# Patient Record
Sex: Female | Born: 1935 | Race: White | Hispanic: No | Smoking: Former smoker
Health system: Southern US, Community
[De-identification: ages and names within clinical notes are randomized; demographics above are authoritative.]

## PROBLEM LIST (undated history)

## (undated) DIAGNOSIS — D649 Anemia, unspecified: Secondary | ICD-10-CM

## (undated) DIAGNOSIS — N289 Disorder of kidney and ureter, unspecified: Secondary | ICD-10-CM

## (undated) DIAGNOSIS — G309 Alzheimer's disease, unspecified: Secondary | ICD-10-CM

## (undated) DIAGNOSIS — F0280 Dementia in other diseases classified elsewhere without behavioral disturbance: Secondary | ICD-10-CM

## (undated) DIAGNOSIS — I10 Essential (primary) hypertension: Secondary | ICD-10-CM

## (undated) HISTORY — PX: OVARIAN CYST REMOVAL: SHX89

## (undated) HISTORY — PX: CHOLECYSTECTOMY: SHX55

## (undated) HISTORY — PX: ABDOMINAL HYSTERECTOMY: SHX81

## (undated) HISTORY — PX: APPENDECTOMY: SHX54

---

## 1997-08-23 HISTORY — PX: OTHER SURGICAL HISTORY: SHX169

## 1998-01-07 ENCOUNTER — Ambulatory Visit (HOSPITAL_COMMUNITY): Admission: RE | Admit: 1998-01-07 | Discharge: 1998-01-07 | Payer: Self-pay | Admitting: Ophthalmology

## 1998-03-17 ENCOUNTER — Ambulatory Visit (HOSPITAL_COMMUNITY): Admission: RE | Admit: 1998-03-17 | Discharge: 1998-03-17 | Payer: Self-pay | Admitting: Ophthalmology

## 1998-05-05 ENCOUNTER — Ambulatory Visit (HOSPITAL_COMMUNITY): Admission: RE | Admit: 1998-05-05 | Discharge: 1998-05-05 | Payer: Self-pay | Admitting: Specialist

## 1998-06-25 ENCOUNTER — Ambulatory Visit (HOSPITAL_COMMUNITY): Admission: RE | Admit: 1998-06-25 | Discharge: 1998-06-25 | Payer: Self-pay | Admitting: Specialist

## 1998-08-17 ENCOUNTER — Emergency Department (HOSPITAL_COMMUNITY): Admission: EM | Admit: 1998-08-17 | Discharge: 1998-08-17 | Payer: Self-pay | Admitting: Emergency Medicine

## 2000-05-13 ENCOUNTER — Encounter: Payer: Self-pay | Admitting: Internal Medicine

## 2000-05-13 ENCOUNTER — Encounter: Admission: RE | Admit: 2000-05-13 | Discharge: 2000-05-13 | Payer: Self-pay | Admitting: Internal Medicine

## 2000-05-20 ENCOUNTER — Inpatient Hospital Stay (HOSPITAL_COMMUNITY): Admission: AD | Admit: 2000-05-20 | Discharge: 2000-06-06 | Payer: Self-pay | Admitting: Internal Medicine

## 2000-05-20 ENCOUNTER — Encounter (INDEPENDENT_AMBULATORY_CARE_PROVIDER_SITE_OTHER): Payer: Self-pay | Admitting: *Deleted

## 2000-05-21 ENCOUNTER — Encounter: Payer: Self-pay | Admitting: Internal Medicine

## 2000-05-23 ENCOUNTER — Encounter: Payer: Self-pay | Admitting: Internal Medicine

## 2000-05-25 ENCOUNTER — Encounter: Payer: Self-pay | Admitting: General Surgery

## 2000-05-25 ENCOUNTER — Encounter: Payer: Self-pay | Admitting: Internal Medicine

## 2000-05-26 ENCOUNTER — Encounter: Payer: Self-pay | Admitting: Internal Medicine

## 2000-06-06 ENCOUNTER — Inpatient Hospital Stay: Admission: RE | Admit: 2000-06-06 | Discharge: 2000-06-14 | Payer: Self-pay | Admitting: Internal Medicine

## 2000-09-27 ENCOUNTER — Encounter (INDEPENDENT_AMBULATORY_CARE_PROVIDER_SITE_OTHER): Payer: Self-pay | Admitting: Specialist

## 2000-09-27 ENCOUNTER — Ambulatory Visit (HOSPITAL_BASED_OUTPATIENT_CLINIC_OR_DEPARTMENT_OTHER): Admission: RE | Admit: 2000-09-27 | Discharge: 2000-09-27 | Payer: Self-pay | Admitting: General Surgery

## 2000-12-06 ENCOUNTER — Encounter: Payer: Self-pay | Admitting: Internal Medicine

## 2000-12-06 ENCOUNTER — Ambulatory Visit (HOSPITAL_COMMUNITY): Admission: RE | Admit: 2000-12-06 | Discharge: 2000-12-06 | Payer: Self-pay | Admitting: *Deleted

## 2000-12-06 ENCOUNTER — Encounter (INDEPENDENT_AMBULATORY_CARE_PROVIDER_SITE_OTHER): Payer: Self-pay | Admitting: Specialist

## 2007-03-12 ENCOUNTER — Encounter: Admission: RE | Admit: 2007-03-12 | Discharge: 2007-03-12 | Payer: Self-pay | Admitting: Internal Medicine

## 2007-03-12 ENCOUNTER — Encounter: Payer: Self-pay | Admitting: Internal Medicine

## 2007-03-15 ENCOUNTER — Inpatient Hospital Stay (HOSPITAL_COMMUNITY): Admission: EM | Admit: 2007-03-15 | Discharge: 2007-03-28 | Payer: Self-pay | Admitting: Emergency Medicine

## 2007-03-21 ENCOUNTER — Encounter (INDEPENDENT_AMBULATORY_CARE_PROVIDER_SITE_OTHER): Payer: Self-pay | Admitting: Internal Medicine

## 2007-03-21 ENCOUNTER — Ambulatory Visit: Payer: Self-pay | Admitting: Vascular Surgery

## 2007-03-23 ENCOUNTER — Encounter: Payer: Self-pay | Admitting: Internal Medicine

## 2007-03-27 ENCOUNTER — Ambulatory Visit: Payer: Self-pay | Admitting: Physical Medicine & Rehabilitation

## 2007-04-14 ENCOUNTER — Emergency Department (HOSPITAL_COMMUNITY): Admission: EM | Admit: 2007-04-14 | Discharge: 2007-04-14 | Payer: Self-pay | Admitting: Emergency Medicine

## 2007-08-24 LAB — CONVERTED CEMR LAB: Pap Smear: NEGATIVE

## 2009-02-26 IMAGING — PT NM PET TUM IMG SKULL BASE T - THIGH
6 series · 25 of 25 positions shown · non-contrast
Comparison: CT dated 03/15/2007

CLINICAL DATA: Evaluate pulmonary nodule

FDG PET-CT TUMOR IMAGING (SKULL BASE TO THIGHS):
Fasting Blood Glucose:  95
TECHNIQUE: 16.3 mCi F-18 FDG was injected intravenously via the left
antecubital fossa .  Full-ring PET imaging was performed from the skull base
through the mid-thighs 113 minutes after injection.  CT data was obtained and
used for attenuation correction and anatomic localization only.  (This was not
acquired as a diagnostic CT examination.)

[Series 1: pet ac · axial · 3.3mm · 4.69mm/px · z∈[-751,-25]mm · 5 of 223 slices shown]
[im 1/223]
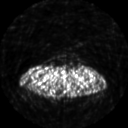
[im 56/223]
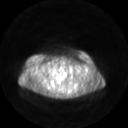
[im 112/223]
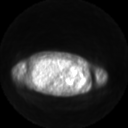
[im 167/223]
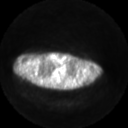
[im 223/223]
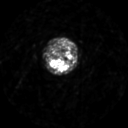

[Series 2: pet nac · axial · 3.3mm · 4.69mm/px · z∈[-751,-25]mm · 6 of 223 slices shown]
[im 1/223]
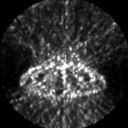
[im 45/223]
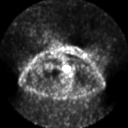
[im 89/223]
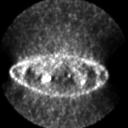
[im 134/223]
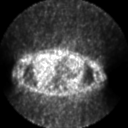
[im 178/223]
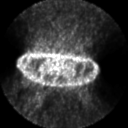
[im 223/223]
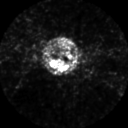

[Series 2: ct images · axial · 3.8mm · 0.98mm/px · z∈[-751,-26]mm · 5 of 223 slices shown]
[im 1/223]
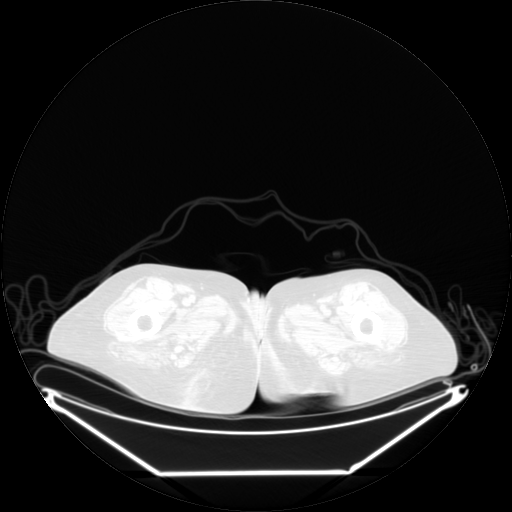
[im 56/223]
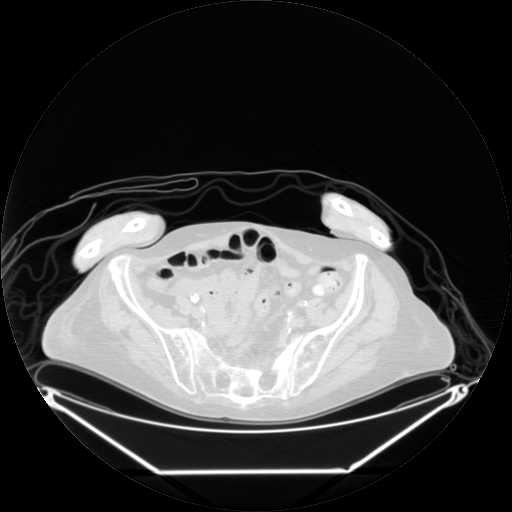
[im 112/223]
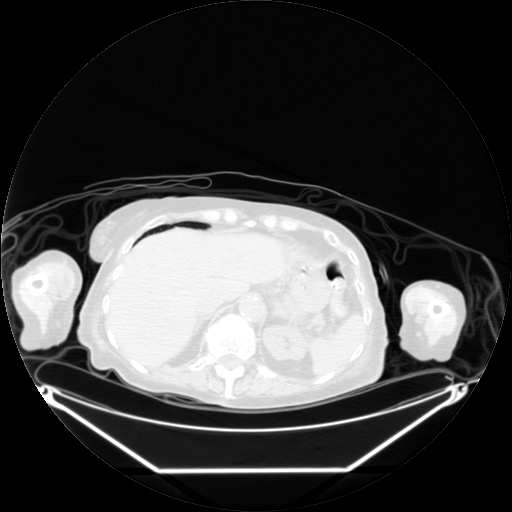
[im 167/223]
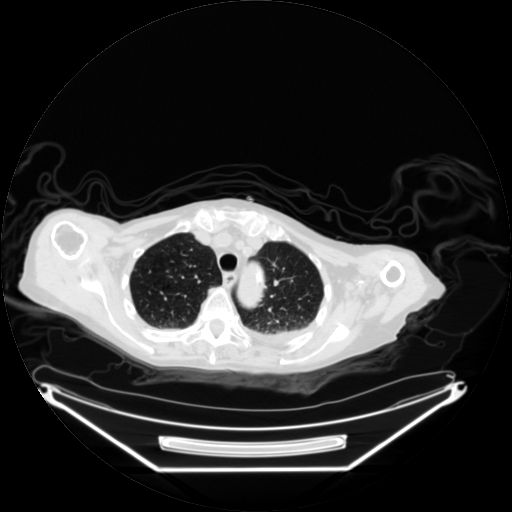
[im 223/223]
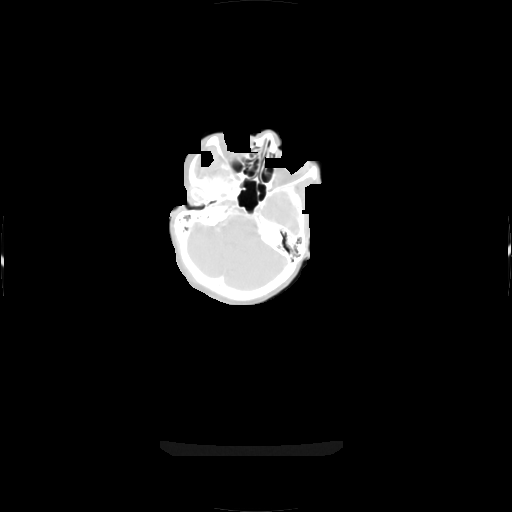

[Series 123: mip · coronal · 3.3mm · 4.69mm/px · 1 of 30 slices shown]
[im 1/30]
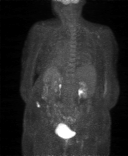

[Series 151: reformatted · axial · 3.3mm · 3.91mm/px · z∈[-751,-25]mm · 6 of 221 slices shown (1 of 2)]
[im 1/221]
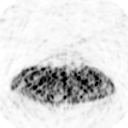
[im 45/221]
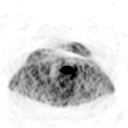
[im 89/221]
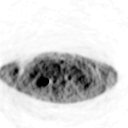
[im 133/221]
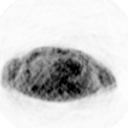
[im 177/221]
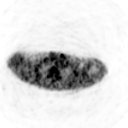
[im 221/221]
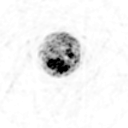

[Series 153: reformatted · coronal · 4.7mm · 5.83mm/px · 2 of 66 slices shown (2 of 2)]
[im 1/66]
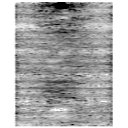
[im 66/66]
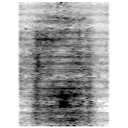

[25 of 25 positions shown; findings below may reference images not displayed]

FINDINGS: There is no hypermetabolic lymph nodes within the soft tissues of the
neck.

Negative for hypermetabolic activity within the axillary or supraclavicular
regions.

Negative for hypermetabolic mediastinal, or hilar lymph nodes.

There is bilateral pleural effusions, left greater than right with overlying
areas of atelectasis.

Pulmonary nodule within the right upper lobe measures 12.1 mm. There is no
abnormal uptake within this nodule. 

No additional pulmonary nodules or masses are identified.

There is atelectasis overlying both effusions.

Calcified gallstones. There is mild gallbladder wall thickening and a small
amount of pericholecystic fluid. 

The pancreas is unremarkable.

There is no hypermetabolic retroperitoneal OR small bowel mesentery lymph nodes.

No free fluid is identified.

There is no hypermetabolic pelvic, or inguinal lymph nodes.

Review of the visualized osseous structures shows no abnormal focus of increased
radiotracer uptake to suggest hypermetabolic bone metastasis.
IMPRESSION: 1. The pulmonary nodule described on prior chest CT does not exhibit any
abnormal FDG uptake likely representing a benign lesion such as a hamartoma or
noncalcified granuloma. As certain lung cancers may have a low metabolic state
and can present as a false negative on PET/CT I would recommend that a followup
chest CT be performed in 3 months. If at this time it this nodule remains stable
then a followup exam in 12 at should be obtained.

2. Mild gallbladder wall thickening and a small amount of pericholecystic fluid.
This may be a nonspecific finding in a patient with heart failure. If there is a
concern for acute cholecystitis a right upper quadrant sonogram would be
recommended .

## 2009-03-10 ENCOUNTER — Encounter: Payer: Self-pay | Admitting: Internal Medicine

## 2009-05-04 ENCOUNTER — Observation Stay (HOSPITAL_COMMUNITY): Admission: EM | Admit: 2009-05-04 | Discharge: 2009-05-05 | Payer: Self-pay | Admitting: Emergency Medicine

## 2009-05-04 ENCOUNTER — Ambulatory Visit: Payer: Self-pay | Admitting: Gastroenterology

## 2009-05-05 ENCOUNTER — Encounter: Payer: Self-pay | Admitting: Internal Medicine

## 2009-05-05 LAB — CONVERTED CEMR LAB
Hgb A1c MFr Bld: 6.3 %
LDL Cholesterol: 30 mg/dL
TSH: 0.912 microintl units/mL

## 2009-05-12 ENCOUNTER — Ambulatory Visit: Payer: Self-pay | Admitting: Gastroenterology

## 2009-05-26 ENCOUNTER — Encounter: Payer: Self-pay | Admitting: Gastroenterology

## 2009-05-26 ENCOUNTER — Ambulatory Visit: Payer: Self-pay | Admitting: Gastroenterology

## 2009-05-29 ENCOUNTER — Encounter: Payer: Self-pay | Admitting: Gastroenterology

## 2009-09-22 ENCOUNTER — Encounter: Payer: Self-pay | Admitting: Internal Medicine

## 2009-11-03 ENCOUNTER — Inpatient Hospital Stay (HOSPITAL_COMMUNITY): Admission: EM | Admit: 2009-11-03 | Discharge: 2009-11-18 | Payer: Self-pay | Admitting: Emergency Medicine

## 2009-11-06 ENCOUNTER — Ambulatory Visit: Payer: Self-pay | Admitting: Internal Medicine

## 2009-11-06 ENCOUNTER — Encounter: Payer: Self-pay | Admitting: Internal Medicine

## 2009-11-06 LAB — CONVERTED CEMR LAB
Cholesterol: 98 mg/dL
HDL: 34 mg/dL
LDL Cholesterol: 41 mg/dL

## 2009-11-11 ENCOUNTER — Encounter: Payer: Self-pay | Admitting: Internal Medicine

## 2009-11-11 LAB — CONVERTED CEMR LAB
ALT: 36 U/L
AST: 15 U/L
Albumin: 2.2 g/dL
Alkaline Phosphatase: 114 U/L
BUN: 4 mg/dL
CO2: 20 meq/L
Calcium: 8.7 mg/dL
Chloride: 112 meq/L
Creatinine, Ser: 1.33 mg/dL
Glucose, Bld: 98 mg/dL
HCT: 30 %
Platelets: 269 K/uL
Potassium: 3.3 meq/L
RBC: 3.05 M/uL
Sodium: 138 meq/L
Total Bilirubin: 0.6 mg/dL
Total Protein: 5.6 g/dL
WBC: 5.5 10*3/microliter

## 2009-11-14 ENCOUNTER — Encounter (INDEPENDENT_AMBULATORY_CARE_PROVIDER_SITE_OTHER): Payer: Self-pay | Admitting: Internal Medicine

## 2009-11-14 ENCOUNTER — Encounter (INDEPENDENT_AMBULATORY_CARE_PROVIDER_SITE_OTHER): Payer: Self-pay | Admitting: *Deleted

## 2009-11-16 ENCOUNTER — Encounter (INDEPENDENT_AMBULATORY_CARE_PROVIDER_SITE_OTHER): Payer: Self-pay | Admitting: *Deleted

## 2009-11-16 ENCOUNTER — Encounter: Payer: Self-pay | Admitting: Internal Medicine

## 2009-11-16 LAB — CONVERTED CEMR LAB
AST: 17 units/L
Albumin: 2.4 g/dL
Alkaline Phosphatase: 99 units/L
CO2: 24 meq/L
Chloride: 106 meq/L
Creatinine, Ser: 1.19 mg/dL
HCT: 32.4 %
Hemoglobin: 11.2 g/dL
Platelets: 359 10*3/uL
RBC: 3.38 M/uL
Sodium: 135 meq/L
WBC: 8.8 10*3/uL

## 2009-11-17 ENCOUNTER — Ambulatory Visit: Payer: Self-pay | Admitting: Vascular Surgery

## 2009-11-17 ENCOUNTER — Encounter: Payer: Self-pay | Admitting: Internal Medicine

## 2009-11-17 ENCOUNTER — Encounter (INDEPENDENT_AMBULATORY_CARE_PROVIDER_SITE_OTHER): Payer: Self-pay | Admitting: Internal Medicine

## 2009-11-17 ENCOUNTER — Encounter (INDEPENDENT_AMBULATORY_CARE_PROVIDER_SITE_OTHER): Payer: Self-pay | Admitting: Family Medicine

## 2009-11-17 LAB — CONVERTED CEMR LAB
ALT: 15 units/L
Albumin: 2.3 g/dL
Alkaline Phosphatase: 92 units/L
BUN: 13 mg/dL
CO2: 25 meq/L
Creatinine, Ser: 1.23 mg/dL
HCT: 30.2 %
Platelets: 347 10*3/uL
RBC: 3.15 M/uL
Total Bilirubin: 0.9 mg/dL

## 2009-11-18 ENCOUNTER — Encounter (INDEPENDENT_AMBULATORY_CARE_PROVIDER_SITE_OTHER): Payer: Self-pay | Admitting: *Deleted

## 2009-11-21 ENCOUNTER — Telehealth: Payer: Self-pay | Admitting: Internal Medicine

## 2010-01-21 ENCOUNTER — Encounter: Payer: Self-pay | Admitting: Internal Medicine

## 2010-01-21 DIAGNOSIS — I10 Essential (primary) hypertension: Secondary | ICD-10-CM

## 2010-02-02 ENCOUNTER — Ambulatory Visit: Payer: Self-pay | Admitting: Internal Medicine

## 2010-02-10 ENCOUNTER — Encounter: Payer: Self-pay | Admitting: Internal Medicine

## 2010-03-05 ENCOUNTER — Ambulatory Visit: Payer: Self-pay | Admitting: Internal Medicine

## 2010-05-01 ENCOUNTER — Ambulatory Visit: Payer: Self-pay | Admitting: Internal Medicine

## 2010-05-02 LAB — CONVERTED CEMR LAB
ALT: 14 units/L (ref 0–35)
BUN: 23 mg/dL (ref 6–23)
Basophils Absolute: 0.1 10*3/uL (ref 0.0–0.1)
CO2: 25 meq/L (ref 19–32)
Chloride: 103 meq/L (ref 96–112)
Creatinine, Ser: 1.3 mg/dL — ABNORMAL HIGH (ref 0.4–1.2)
Eosinophils Absolute: 0.1 10*3/uL (ref 0.0–0.7)
Eosinophils Relative: 1.9 % (ref 0.0–5.0)
Glucose, Bld: 98 mg/dL (ref 70–99)
HCT: 37.1 % (ref 36.0–46.0)
Lipase: 29 units/L (ref 11.0–59.0)
Lymphs Abs: 2 10*3/uL (ref 0.7–4.0)
MCHC: 34.5 g/dL (ref 30.0–36.0)
MCV: 96.1 fL (ref 78.0–100.0)
Monocytes Absolute: 0.6 10*3/uL (ref 0.1–1.0)
Platelets: 320 10*3/uL (ref 150.0–400.0)
Potassium: 3.7 meq/L (ref 3.5–5.1)
RDW: 13.5 % (ref 11.5–14.6)
TSH: 0.59 microintl units/mL (ref 0.35–5.50)
Total Bilirubin: 0.6 mg/dL (ref 0.3–1.2)

## 2010-05-04 ENCOUNTER — Telehealth: Payer: Self-pay | Admitting: Gastroenterology

## 2010-05-06 ENCOUNTER — Ambulatory Visit: Payer: Self-pay | Admitting: Gastroenterology

## 2010-05-11 ENCOUNTER — Ambulatory Visit: Payer: Self-pay | Admitting: Internal Medicine

## 2010-05-11 LAB — CONVERTED CEMR LAB
Cholesterol: 159 mg/dL (ref 0–200)
HDL: 49.1 mg/dL (ref >39.00)

## 2010-05-27 ENCOUNTER — Ambulatory Visit: Payer: Self-pay | Admitting: Gastroenterology

## 2010-09-20 LAB — CONVERTED CEMR LAB
ALT: 27 units/L
ALT: 28 units/L
AST: 22 units/L
Albumin: 4.2 g/dL
BUN: 20 mg/dL
Calcium: 10.3 mg/dL
Calcium: 10.5 mg/dL
Chloride: 102 meq/L
Creatinine, Ser: 1.3 mg/dL
Glucose, Bld: 100 mg/dL
Glucose, Bld: 92 mg/dL
HCT: 37.9 %
Hemoglobin: 13 g/dL
Potassium: 4 meq/L
Potassium: 4.3 meq/L
Sodium: 138 meq/L
Total Bilirubin: 0.5 mg/dL
Total Protein: 8.2 g/dL
Total Protein: 8.3 g/dL

## 2010-09-24 NOTE — Assessment & Plan Note (Signed)
Summary: new pt/medicare/bcbs/#/lb   Vital Signs:  Patient profile:   77 year old female Height:      64.5 inches (163.83 cm) Weight:      122.8 pounds (55.82 kg) BMI:     20.83 O2 Sat:      95 % on Room air Temp:     97.2 degrees F (36.22 degrees C) oral Pulse rate:   76 / minute BP sitting:   132 / 70  (left arm) Cuff size:   regular  Vitals Entered By: Orlan Leavens (February 02, 2010 3:10 PM)  O2 Flow:  Room air CC: New patient/ Req [facility] on [facility] Is Patient Diabetic? No Pain Assessment Patient in pain? no        Primary Care Provider:  Newt Lukes MD  CC:  New patient/ Req refills on [facility].  History of Present Illness: new pt to me and our practice, here to est care prev followed at [facility] medical but txfr care here  1) recent hosp 10/2009 for bilary pancreatitis - dc summary and labs reviewed -  still recovering from surg but feeling well - no abd pain or trouble with by mouth intake -  no diarrhea or n/v, no anorexia or weight loss - still holding hctz and statin from hosp for same  2) HTN - reports compliance with ongoing medical treatment and no changes in medication dose or frequency. denies adverse side effects related to current therapy. no CP, edema, HA or vision changes  3) dyslipidemia- reports compliance with ongoing medical treatment and no changes in medication dose or frequency. denies adverse side effects related to current therapy. as above, off statin tx last 6 weeks since hosp due to panc but tol low dose simva for years w/o problems  4) c/o anxiety - long hx same, symptoms present > 2 yrs - gradual worsening of symptoms since illness -  no panic attacks or insomnia, just "nervous all the time" never on meds for same and declines meds that might cause sedation or "addiction"  Preventive Screening-Counseling & Management  Alcohol-Tobacco     Alcohol drinks/day: 0  Alcohol Counseling: not indicated; patient does not drink     Smoking Status: quit     Tobacco Counseling: to remain off tobacco products  Caffeine-Diet-Exercise     Exercise Counseling: not indicated; exercise is adequate     Depression Counseling: not indicated; screening negative for depression  Safety-Violence-Falls     Seat Belt Counseling: not indicated; patient wears seat belts     Firearms in the Home: no firearms in the home     Smoke Detector Counseling: n/a     Violence Counseling: not indicated; no violence risk noted     Fall Risk Counseling: not indicated; no significant falls noted  Clinical Review Panels:  Prevention   Last Mammogram:  No specific mammographic evidence of malignancy.   (08/24/2007)   Last Pap Smear:  Interpretation/Result:Negative for intraepithelial Lesion or Malignancy.    (08/24/2007)   Last Colonoscopy:  Location:  [facility].  (05/26/2009)  Lipid Management   Cholesterol:  98 (11/06/2009)   LDL (bad choesterol):  41 (11/06/2009)   HDL (good cholesterol):  34 (11/06/2009)   Triglycerides:  113 (11/06/2009)  CBC   WBC:  7.6 (11/17/2009)   RBC:  3.15 (11/17/2009)   Hgb:  10.4 (11/17/2009)   Hct:  30.2 (11/17/2009)   Platelets:  347 (  11/17/2009)  Complete Metabolic Panel   Glucose:  106 (11/17/2009)   Sodium:  135 (11/17/2009)   Potassium:  4.0 (11/17/2009)   Chloride:  105 (11/17/2009)   CO2:  25 (11/17/2009)   BUN:  13 (11/17/2009)   Creatinine:  1.23 (11/17/2009)   Albumin:  2.3 (11/17/2009)   Total Protein:  6.0 (11/17/2009)   Calcium:  9.1 (11/17/2009)   Total Bili:  0.9 (11/17/2009)   Alk Phos:  92 (11/17/2009)   SGPT (ALT):  15 (11/17/2009)   SGOT (AST):  16 (11/17/2009)   Current Medications (verified): 1)  Triamterene-Hctz 37.5-25 Mg Tabs (Triamterene-Hctz) .... Take 1/2 By Mouth Once Daily 2)  Temazepam 7.5 Mg Caps (Temazepam) .... Take 1 By Mouth At Bedtime As Needed  Allergies (verified): 1)  !  Celexa 2)  ! Aspirin 3)  ! Monopril 4)  ! * Hyddromorphone  Past History:  Past Medical History: Hyperlipidemia Hypertension Asthma Colonic polyps, hx of  Past Surgical History: Cholecystectomy Intestinal surgery for chrons 1999  Family History: Family History Hypertension (other relative) Stroke (other relative) Family History Diabetes 1st degree relative (other relative)  Social History: Former Smoker, no alcohol widowed, lives alone in her own home - drives and indep ADLs supportive neice judy jones who assists pt as needed  Smoking Status:  quit  Review of Systems       see HPI above. I have reviewed all other systems and they were negative.   Physical Exam  General:  alert, well-developed, well-nourished, and cooperative to examination.   young appearing for stated age, neice judy at side Eyes:  vision grossly intact; pupils equal, round and reactive to light.  conjunctiva and lids normal.   wears glasses Ears:  normal pinnae bilaterally, without erythema, swelling, or tenderness to palpation. TMs clear, without effusion, or cerumen impaction. Hearing grossly normal bilaterally  Mouth:  teeth and gums in good repair; mucous membranes moist, without lesions or ulcers. oropharynx clear without exudate, no erythema.  Lungs:  normal respiratory effort, no intercostal retractions or use of accessory muscles; normal breath sounds bilaterally - no crackles and no wheezes.    Heart:  normal rate, regular rhythm, no murmur, and no rub. BLE without edema. normal DP pulses and normal cap refill in all 4 extremities    Abdomen:  soft, non-tender, normal bowel sounds, no distention; no masses and no appreciable hepatomegaly or splenomegaly.   Msk:  No deformity or scoliosis noted of thoracic or lumbar spine.   Neurologic:  alert & oriented X3 and cranial nerves II-XII symetrically intact.  strength normal in all extremities, sensation intact to light touch, and gait normal. speech  fluent without dysarthria or aphasia; follows commands with good comprehension.  Skin:  no rashes, vesicles, ulcers, or erythema. No nodules or irregularity to palpation.  Psych:  Oriented X3, memory intact for recent and remote, normally interactive, good eye contact, not anxious appearing, not depressed appearing, and not agitated.      Impression & Recommendations:  Problem # 1:  Hosp for GALLSTONE PANCREATITIS (ICD-577.9) hosp course,labs, procedures and dc summary reviewed - s/p chole - symptoms improved - still holding statin and hctz - likely same to resume each now but will do so next visit as starting other new meds for anxiety - see next  Problem # 2:  ANXIETY (ICD-300.00)  long hx same - will start SSRI - slow titiration of med for effective symptoms mgmt - new erx done f/u 4-6 weeks to review med  and symptoms - Time spent with patient/niece 45 minutes, more than 50% of this time was spent counseling patient on anxiety and recent hospitalization Her updated medication list for this problem includes:    Paroxetine Hcl 12.5 Mg Xr24h-tab (Paroxetine hcl) .Marland Kitchen... 1 by mouth once daily x 2 weesk, then increase to 2 by mouth once daily  (or as directed)  Orders: Prescription Created Electronically [phone])  Problem # 3:  HYPERLIPIDEMIA (ICD-272.4)  holding statin since [facility] hosp - cont same for now until tolerance of SSRI established so as not to confuse potential SE or adv rxn - also review prior labs form prior PCP - to send for records The following medications were removed from the medication list:    Simvastatin 20 Mg Tabs (Simvastatin) .Marland Kitchen... Take 1 by mouth at bedtime  Labs Reviewed: SGOT: 16 (11/17/2009)   SGPT: 15 (11/17/2009)   HDL:34 (11/06/2009), 49 (05/05/2009)  LDL:41 (11/06/2009), 30 (05/05/2009)  Chol:98 (11/06/2009), 95 (05/05/2009)  Trig:113 (11/06/2009), 80 (05/05/2009)  Problem # 4:  HYPERTENSION (ICD-401.9)  The following medications were removed from the  medication list:    Amlodipine Besylate 10 Mg Tabs (Amlodipine besylate) .Marland Kitchen... Take 1 by mouth once daily Her updated medication list for this problem includes:    Triamterene-hctz 37.5-25 Mg Tabs (Triamterene-hctz) .Marland Kitchen... Take 1/2 by mouth once daily  BP today: 132/70  Labs Reviewed: K+: 4.0 (11/17/2009) Creat: : 1.23 (11/17/2009)   Chol: 98 (11/06/2009)   HDL: 34 (11/06/2009)   LDL: 41 (11/06/2009)   TG: 113 (11/06/2009)  Complete Medication List: 1)  Triamterene-hctz 37.5-25 Mg Tabs (Triamterene-hctz) .... Take 1/2 by mouth once daily 2)  Temazepam 7.5 Mg Caps (Temazepam) .... Take 1 by mouth at bedtime as needed 3)  Paroxetine Hcl 12.5 Mg Xr24h-tab (Paroxetine hcl) .Marland Kitchen.. 1 by mouth once daily x 2 weesk, then increase to 2 by mouth once daily  (or as directed)  Patient Instructions: 1)  it was good to see you today. 2)  your hospitalization labs and medications have been reviewed today 3)  will send for release of records from [facility] as discussed to review and incorporate into our records here. 4)  start generic low dose paxil (paroxetine) for anxiety as discussed - this prescription has been electronically submitted to your local pharmacy. Please take as directed. Contact our office if you believe you're having problems with the medication(s).  5)  refill on other medications as requested - continue to hold hctz and simvastatin for now 6)  Please schedule a follow-up appointment in 4-6 weeks to review anxiety symptoms and medication effects, call sooner if problems.  Prescriptions: PAROXETINE HCL 12.5 MG XR24H-TAB (PAROXETINE HCL) 1 by mouth once daily x 2 weesk, then increase to 2 by mouth once daily  (or as directed)  #60 x 1   Entered and Authorized by:   Newt Lukes MD   Signed by:   Newt Lukes MD on 02/02/2010   Method used:   Electronically to        [facility] [address] (retail)       [address]       [place],  [place]  [address]       Ph: [phone] or [phone]       Fax: [phone]   RxID:   [id number] TRIAMTERENE-HCTZ 37.5-25 MG TABS (TRIAMTERENE-HCTZ) take 1/2 by mouth once daily  #45 x 2   Entered by:  Orlan Leavens   Authorized by:   Newt Lukes MD   Signed by:   Orlan Leavens on 02/02/2010   Method used:   Faxed to ...       [facility])             , [place]         Ph: [phone]       Fax: [phone]   RxID:   [id number]    Immunization History:  Pneumovax Immunization History:    Pneumovax:  historical (08/23/2008)    Mammogram  Procedure date:  08/24/2007  Findings:      No specific mammographic evidence of malignancy.    Pap Smear  Procedure date:  08/24/2007  Findings:      Interpretation/Result:Negative for intraepithelial Lesion or Malignancy.

## 2010-09-24 NOTE — Assessment & Plan Note (Signed)
Summary: F/U Diarrhea, Wt Loss, Nausea, saw P.Guenther RNP   History of Present Illness Visit Type: Follow-up Visit Primary GI MD: Melvia Heaps MD [facility] Primary Provider: Newt Lukes MD Requesting Provider: Rene Paci, MD Chief Complaint: nausea has improved, still having some loose stools,gained 2 pounds History of Present Illness:   Stephanie Colon has returned for followup of her diarrhea and nausea.  Symptoms have continued  to improve.  She has gained 2 pounds.  She has 3-4 stools a day which are becoming more formed.  This is close to her baseline.   GI Review of Systems    Reports loss of appetite.      Denies abdominal pain, acid reflux, belching, bloating, chest pain, dysphagia with liquids, dysphagia with solids, heartburn, nausea, vomiting, vomiting blood, weight loss, and  weight gain.      Reports diarrhea.     Denies anal fissure, black tarry stools, change in bowel habit, constipation, diverticulosis, fecal incontinence, heme positive stool, hemorrhoids, irritable bowel syndrome, jaundice, light color stool, liver problems, rectal bleeding, and  rectal pain.    Current Medications (verified): 1)  Triamterene-Hctz 37.5-25 Mg Tabs (Triamterene-Hctz) .... Take 1/2 By Mouth Once Daily 2)  Temazepam 7.5 Mg Caps (Temazepam) .... Take 1 By Mouth At Bedtime As Needed 3)  Paroxetine Hcl 25 Mg Xr24h-Tab (Paroxetine Hcl) .Marland Kitchen.. 1 By Mouth Once Daily 4)  Ondansetron 4 Mg Tbdp (Ondansetron) .Marland Kitchen.. 1 By Mouth Every 8 Hours As Needed For Nausea 5)  Promethazine Hcl 12.5 Mg Tabs (Promethazine Hcl) .Marland Kitchen.. 1 By Mouth Every 4 Hours As Needed For Nausea 6)  Align 4 Mg Caps (Probiotic Product) .... Take 1 Capsule Daily X 14 Days  Allergies (verified): 1)  ! Celexa 2)  ! Aspirin 3)  ! Monopril 4)  ! * Hyddromorphone  Past History:  Past Medical History: Reviewed history from 05/11/2010 and no changes required. Hyperlipidemia - off statin since 10/2009 d/t  panc Hypertension Asthma Colonic polyps, hx of anxiety pancreatitis - hosp 10/2009 Gallstones    MD roster: GIArlyce Dice  Past Surgical History: Reviewed history from 05/06/2010 and no changes required. Cholecystectomy Intestinal surgery for chrons 1999 Ovarian cyst removed  Family History: Reviewed history from 02/02/2010 and no changes required. Family History Hypertension (other relative) Stroke (other relative) Family History Diabetes 1st degree relative (other relative)  Social History: Reviewed history from 05/06/2010 and no changes required. Former Smoker, no alcohol widowed, lives alone in her own home - drives and indep ADLs supportive neice judy jones who assists pt as needed  Illicit Drug Use - no  Review of Systems       The patient complains of fatigue.  The patient denies allergy/sinus, anemia, anxiety-new, arthritis/joint pain, back pain, blood in urine, breast changes/lumps, change in vision, confusion, cough, coughing up blood, depression-new, fainting, fever, headaches-new, hearing problems, heart murmur, heart rhythm changes, itching, menstrual pain, muscle pains/cramps, night sweats, nosebleeds, pregnancy symptoms, shortness of breath, skin rash, sleeping problems, sore throat, swelling of feet/legs, swollen lymph glands, thirst - excessive , urination - excessive , urination changes/pain, urine leakage, vision changes, and voice change.    Vital Signs:  Patient profile:   77 year old female Height:      64.5 inches Weight:      115 pounds BMI:     19.51 Pulse rate:   80 / minute Pulse rhythm:   regular BP sitting:   112 / 64  (left arm) Cuff size:   regular  Vitals Entered By: June McMurray CMA Duncan Dull) (May 27, 2010 10:25 AM)   Impression & Recommendations:  Problem # 1:  ANOREXIA (ICD-783.0) Assessment Improved I suspect her anorexia and nausea were related to Flagyl.  Problem # 2:  WEIGHT LOSS-ABNORMAL (ICD-783.21) Assessment:  Improved  Problem # 3:  DIARRHEA (ICD-787.91) Assessment: Improved She seemed to have a response to Flagyl.  Recommendations #1 no further GI workup or therapy  Patient Instructions: 1)  Copy sent to : Newt Lukes MD 2)  Call back as needed  3)  The medication list was reviewed and reconciled.  All changed / newly prescribed medications were explained.  A complete medication list was provided to the patient / caregiver.

## 2010-09-24 NOTE — Procedures (Signed)
Summary: Normand Sloop MD  Normand Sloop MD   Imported By: Lester [place] 02/11/2010 09:14:32  _____________________________________________________________________  External Attachment:    Type:   Image     Comment:   External Document

## 2010-09-24 NOTE — Op Note (Signed)
Summary: OR REPORT    NAME:  Stephanie Colon, Stephanie Colon                  ACCOUNT NO.:  [id number]      MEDICAL RECORD NO.:  [id number]          PATIENT TYPE:  INP      LOCATION:  1332                         FACILITY:  [facility]      PHYSICIAN:  Abigail Miyamoto, M.D. DATE OF BIRTH:  31-Dec-1918      DATE OF PROCEDURE:  11/14/2009   DATE OF DISCHARGE:                                  OPERATIVE REPORT      PREOPERATIVE DIAGNOSIS:  Gallstone pancreatitis.      POSTOPERATIVE DIAGNOSIS:  Gallstone pancreatitis.      PROCEDURE:  Laparoscopic cholecystectomy with intraoperative   cholangiogram.      SURGEON:  Abigail Miyamoto, M.D.      ANESTHESIA:  General endotracheal anesthesia.      ESTIMATED BLOOD LOSS:  Minimal.      FINDINGS:  The patient was found to have a chronically scarred-appearing   gallbladder.  Cholangiogram was performed.  The patient had a dilated   common bile duct, but no evidence of any obstruction or remaining stone.      PROCEDURE IN DETAIL:  The patient was brought to the operating room and   identified as Stephanie Colon.  She was placed supine on the operating table   and general anesthesia was induced.  Her abdomen was then prepped and   draped in the usual sterile fashion.  Using a #15-blade, a small   vertical incision was made just below the patient's umbilicus through a   previous scar.  This was carried down to the fascia, which was opened   with a scalpel.  A hemostat was then used to pass through the peritoneal   cavity under direct vision.  A 0 Vicryl pursestring suture was placed   around the fascial opening.  The Hasson port was placed through the   opening and insufflation of the abdomen was begun.  A 5-mm port was   placed in the patient's epigastrium and 2 more 5-mm ports were placed in   the patient's right upper quadrant, all under direct vision.  The   gallbladder was then grasped and retracted above the liver bed.   Dissection was then carried out at the base of  the gallbladder.  The   cystic duct and cystic artery were easily dissected out and a critical   window was achieved around both.  I clipped the artery twice proximally   and distally, as well as several posterior branches which were all   small.  I then clipped the duct once distally and opened it with   laparoscopic scissors.  A Cook catheter was then placed in the right   upper quadrant under direct vision.  I placed this into the cystic duct.   I then performed a cholangiogram with contrast under fluoroscopy.   Contrast was seen to flow into the entire biliary system and duodenum   without evidence of obstruction.  The common bile duct was still   dilated.  At this point, the cholangiocatheter was removed.  I clipped   the cystic duct 4 times proximally, completely transected it, and then   transected the artery.  The gallbladder was then easily dissected free   from the liver bed with electrocautery.  Once the gallbladder was free   from the liver bed, the liver bed was examined and hemostasis was   achieved with cautery.  I then removed the gallbladder through the   incision at the umbilicus.  The 0 Vicryl at the umbilicus was then tied   in place, closing the fascial defect.      I then examined the patient's abdomen and found an extensive amount of   adhesions to her ventral hernia which was below the midline.  At this   point, I irrigated the abdomen with normal saline.  Again, hemostasis   appeared to be achieved.  All ports were then removed under direct   vision and the abdomen was deflated.  All incisions was then   anesthetized with 0.25% Marcaine and then closed with 4-0 Monocryl   subcuticular sutures.  Steri-Strips and Band-Aids were then applied.   The patient tolerated the procedure well.  All counts were correct at   the end of the procedure.  The patient was then extubated in the   operating room and taken in stable condition to the recovery room.                Abigail Miyamoto, M.D.            DB/MEDQ  D:  11/14/2009  T:  11/14/2009  Job:  [id number]      Electronically Signed by Abigail Miyamoto M.D. on 11/19/2009 12:57:59 PM

## 2010-09-24 NOTE — Assessment & Plan Note (Signed)
Summary: f/u diarrhea/cd   Vital Signs:  Patient profile:   77 year old female Height:      64.5 inches (163.83 cm) Weight:      116.2 pounds (52.82 kg) O2 Sat:      97 % on Room air Temp:     97.6 degrees F (36.44 degrees C) oral Pulse rate:   81 / minute BP sitting:   122 / 62  (left arm) Cuff size:   regular  Vitals Entered By: Orlan Leavens RMA (May 01, 2010 1:36 PM)  O2 Flow:  Room air CC: Diarrhea & nausea x's 2 weeks, Headache Is Patient Diabetic? No Pain Assessment Patient in pain? no        Primary Care Provider:  Newt Lukes MD  CC:  Diarrhea & nausea x's 2 weeks and Headache.  History of Present Illness: c/o diarrhea onset 2 weeks ago - assoc with nausea describes as liquid tan/yellow brown stool - no BRBPR or melena no abd pain/cramping, vomitting, or fever 2-3 BM/day, not improved with fasting or change in food intake some improved (dec freq) with immodium use but still liquid stool no recent travel, change in meds or undercooked foods -  other chronic med issues reviewed: anxiety -  improved with addition of generic paxil -reports compliance with ongoing medical treatment and no changes in medication dose or frequency. denies adverse side effects related to current therapy.   prior hosp 10/2009 for bilary pancreatitis -  feeling well until diarrhea began- no abd pain or trouble with by mouth intake -  no pain now as before- no anorexia or weight loss - still holding statin die to same  HTN - reports compliance with ongoing medical treatment and no changes in medication dose or frequency. denies adverse side effects related to current therapy. no CP, edema, HA or vision changes  dyslipidemia- reports compliance with ongoing medical treatment and no changes in medication dose or frequency. denies adverse side effects related to current therapy. as above, off statin tx since 10/2009 hosp for pancreatitis but tol low dose simva for years w/o  problems     Clinical Review Panels:  CBC   WBC:  7.6 (11/17/2009)   RBC:  3.15 (11/17/2009)   Hgb:  10.4 (11/17/2009)   Hct:  30.2 (11/17/2009)   Platelets:  347 (11/17/2009)  Complete Metabolic Panel   Glucose:  106 (11/17/2009)   Sodium:  135 (11/17/2009)   Potassium:  4.0 (11/17/2009)   Chloride:  105 (11/17/2009)   CO2:  25 (11/17/2009)   BUN:  13 (11/17/2009)   Creatinine:  1.23 (11/17/2009)   Albumin:  2.3 (11/17/2009)   Total Protein:  6.0 (11/17/2009)   Calcium:  9.1 (11/17/2009)   Total Bili:  0.9 (11/17/2009)   Alk Phos:  92 (11/17/2009)   SGPT (ALT):  15 (11/17/2009)   SGOT (AST):  16 (11/17/2009)   Current Medications (verified): 1)  Triamterene-Hctz 37.5-25 Mg Tabs (Triamterene-Hctz) .... Take 1/2 By Mouth Once Daily 2)  Temazepam 7.5 Mg Caps (Temazepam) .... Take 1 By Mouth At Bedtime As Needed 3)  Paroxetine Hcl 12.5 Mg Xr24h-Tab (Paroxetine Hcl) .Marland Kitchen.. 1 By Mouth Once Daily  Allergies (verified): 1)  ! Celexa 2)  ! Aspirin 3)  ! Monopril 4)  ! * Hyddromorphone  Past History:  Past Medical History: Hyperlipidemia - off statin since 10/2009 d/t panc Hypertension Asthma Colonic polyps, hx of anxiety pancreatitis - hosp 10/2009  Review of Systems  The patient  denies weight loss, chest pain, syncope, peripheral edema, and headaches.    Physical Exam  General:  alert, well-developed, well-nourished, and cooperative to examination.   young appearing for stated age -dtr at side Lungs:  normal respiratory effort, no intercostal retractions or use of accessory muscles; normal breath sounds bilaterally - no crackles and no wheezes.    Heart:  normal rate, regular rhythm, no murmur, and no rub. BLE without edema.  Abdomen:  soft, non-tender, hyperactive bowel sounds, no distention; no masses and no appreciable hepatomegaly or splenomegaly.     Impression & Recommendations:  Problem # 1:  DIARRHEA (ICD-787.91)  likely infx - check labs and start  emperic flagyl - symptomatic med tx of assoc nausea - zofran and or promethazine consider other imaging depending on labs and response to med tx reviewed plan with pt/dtr - to go to ER if unable to tol meds, progressive dehydration, pain/fever, etc - they understand and agree Orders: TLB-BMP (Basic Metabolic Panel-BMET) (80048-METABOL) TLB-CBC Platelet - w/Differential (85025-CBCD) TLB-Hepatic/Liver Function Pnl (80076-HEPATIC) TLB-TSH (Thyroid Stimulating Hormone) (84443-TSH) TLB-Lipase (83690-LIPASE) Prescription Created Electronically [id number])  Discussed symptom control and diet. Call if worsening of symptoms or signs of dehydration.   Problem # 2:  NAUSEA (ICD-787.02)  Her updated medication list for this problem includes:    Ondansetron 4 Mg Tbdp (Ondansetron) .Marland Kitchen... 1 by mouth every 8 hours as needed for nausea  Orders: TLB-BMP (Basic Metabolic Panel-BMET) (80048-METABOL) TLB-CBC Platelet - w/Differential (85025-CBCD) TLB-Hepatic/Liver Function Pnl (80076-HEPATIC) TLB-TSH (Thyroid Stimulating Hormone) (84443-TSH) TLB-Lipase (83690-LIPASE) Prescription Created Electronically [id number])  Discussed symptom control.   Complete Medication List: 1)  Triamterene-hctz 37.5-25 Mg Tabs (Triamterene-hctz) .... Take 1/2 by mouth once daily 2)  Temazepam 7.5 Mg Caps (Temazepam) .... Take 1 by mouth at bedtime as needed 3)  Paroxetine Hcl 12.5 Mg Xr24h-tab (Paroxetine hcl) .Marland Kitchen.. 1 by mouth once daily 4)  Ondansetron 4 Mg Tbdp (Ondansetron) .Marland Kitchen.. 1 by mouth every 8 hours as needed for nausea 5)  Promethazine Hcl 12.5 Mg Tabs (Promethazine hcl) .Marland Kitchen.. 1 by mouth every 4 hours as needed for nausea 6)  Metronidazole 250 Mg Tabs (Metronidazole) .Marland Kitchen.. 1 by mouth three times a day x 7 days  Patient Instructions: 1)  it was good to see you today. 2)  test(s) ordered today - your results will be called to you after review in 48-72 hours from the time of test completion 3)  start flagyl (antibiotics) and  antinausea medications as discussed - your prescriptions have been electronically submitted to your pharmacy. Please take as directed. Contact our office if you believe you're having problems with the medication(s).  4)  if your symptoms continue to worsen (pain, fever, etc), or if you are unable take anything by mouth (pills, fluids, etc), you should go to the emergency room for further evaluation and treatment.  5)  keep follow as previously scheduled 05/11/10 - will do flu shot at that visit Prescriptions: METRONIDAZOLE 250 MG TABS (METRONIDAZOLE) 1 by mouth three times a day x 7 days  #21 x 0   Entered and Authorized by:   Newt Lukes MD   Signed by:   Newt Lukes MD on 05/01/2010   Method used:   Electronically to        [facility] [phone]* (retail)       [address]       [place], [place]  [address]  Ph: [phone] or [phone]       Fax: [phone]   RxID:   [id number] PROMETHAZINE HCL 12.5 MG TABS (PROMETHAZINE HCL) 1 by mouth every 4 hours as needed for nausea  #30 x 0   Entered and Authorized by:   Newt Lukes MD   Signed by:   Newt Lukes MD on 05/01/2010   Method used:   Electronically to        [facility]* (retail)       [address]       [place], [place]  [address]       Ph: [phone] or [phone]       Fax: [phone]   RxID:   [id number] ONDANSETRON 4 MG TBDP (ONDANSETRON) 1 by mouth every 8 hours as needed for nausea  #30 x 0   Entered and Authorized by:   Newt Lukes MD   Signed by:   Newt Lukes MD on 05/01/2010   Method used:   Electronically to        [facility]* (retail)       [address]       [place], [place]  [address]       Ph: [phone] or [phone]       Fax: [phone]   RxID:   [id number]

## 2010-09-24 NOTE — Assessment & Plan Note (Signed)
Summary: DIARRHEA      (DR.KAPLAN PT.)    Stephanie Colon   History of Present Illness Visit Type: Initial Consult Primary GI MD: Melvia Heaps MD [facility] Primary Provider: Newt Lukes MD Requesting Provider: Rene Paci, MD Chief Complaint: Diarrhea, nausea x 3 weeks History of Present Illness:   Patient is a 77 year old female followed by Dr. Arlyce Dice for history of colon polyps. Presents with a 3 week history of nausea and diarrhea. She had a cholcystectomy in March and has had had some loose stools since. Over last three weeks she has been  having several loose stools a day associated with some incontinence. Saw PCP around 04/30/10,  labs were normal. Given Flagyl and has had significant improvement in diarrhea. No nausea in several days. No abdominal pain. Main concern is that of poor intake. Reports poor appetite. Niece states patient isn't eating because she is scared of having recurrent nausea. Patient does drink Ensure twice daily.    GI Review of Systems    Reports belching, bloating, loss of appetite, nausea, vomiting, and  weight loss.   Weight loss of 3 pounds over 1 week.    Reports diarrhea and  hemorrhoids.     Denies anal fissure, black tarry stools, change in bowel habit, constipation, diverticulosis, fecal incontinence, heme positive stool, irritable bowel syndrome, jaundice, light color stool, liver problems, rectal bleeding, and  rectal pain. Preventive Screening-Counseling & Management      Drug Use:  no.      Current Medications (verified): 1)  Triamterene-Hctz 37.5-25 Mg Tabs (Triamterene-Hctz) .... Take 1/2 By Mouth Once Daily 2)  Temazepam 7.5 Mg Caps (Temazepam) .... Take 1 By Mouth At Bedtime As Needed 3)  Paroxetine Hcl 12.5 Mg Xr24h-Tab (Paroxetine Hcl) .Marland Kitchen.. 1 By Mouth Once Daily 4)  Ondansetron 4 Mg Tbdp (Ondansetron) .Marland Kitchen.. 1 By Mouth Every 8 Hours As Needed For Nausea 5)  Promethazine Hcl 12.5 Mg Tabs (Promethazine Hcl) .Marland Kitchen.. 1 By Mouth Every 4 Hours As Needed  For Nausea 6)  Metronidazole 250 Mg Tabs (Metronidazole) .Marland Kitchen.. 1 By Mouth Three Times A Day X 7 Days  Allergies (verified): 1)  ! Celexa 2)  ! Aspirin 3)  ! Monopril 4)  ! * Hyddromorphone  Past History:  Past Medical History: Hyperlipidemia - off statin since 10/2009 d/t panc Hypertension Asthma Colonic polyps, hx of anxiety pancreatitis - hosp 10/2009 Gallstones  Past Surgical History: Cholecystectomy Intestinal surgery for chrons 1999 Ovarian cyst removed  Family History: Reviewed history from 02/02/2010 and no changes required. Family History Hypertension (other relative) Stroke (other relative) Family History Diabetes 1st degree relative (other relative)  Social History: Former Smoker, no alcohol widowed, lives alone in her own home - drives and indep ADLs supportive neice judy jones who assists pt as needed  Illicit Drug Use - no Drug Use:  no  Review of Systems       The patient complains of anxiety-new, back pain, fatigue, hearing problems, sleeping problems, swelling of feet/legs, and thirst - excessive.  The patient denies allergy/sinus, anemia, arthritis/joint pain, blood in urine, breast changes/lumps, change in vision, confusion, cough, coughing up blood, depression-new, fainting, fever, headaches-new, heart murmur, heart rhythm changes, itching, menstrual pain, muscle pains/cramps, night sweats, nosebleeds, pregnancy symptoms, shortness of breath, skin rash, sore throat, swollen lymph glands, thirst - excessive , urination - excessive , urination changes/pain, urine leakage, vision changes, and voice change.    Vital Signs:  Patient profile:   77 year old female  Height:      64.5 inches Weight:      113.13 pounds BMI:     19.19 Pulse rate:   80 / minute Pulse rhythm:   regular BP sitting:   100 / 56  (left arm) Cuff size:   regular  Vitals Entered By: June McMurray CMA Duncan Dull) (May 06, 2010 10:28 AM)  Physical Exam  General:  Pleasant, thin  white female in NAD. Head:  Normocephalic and atraumatic. Eyes:  Conjunctiva pink, no icterus.  Mouth:  No oral lesions. Tongue moist.  Neck:  no obvious masses  Lungs:  Clear throughout to auscultation. Heart:  RRR. Abdomen:  Abdomen soft, nontender, nondistended. No obvious masses or hepatomegaly.Normal bowel sounds.  Msk:  Symmetrical with no gross deformities. Normal posture. Extremities:  No palmar erythema, no edema.  Neurologic:  Alert and  oriented x4;  grossly normal neurologically. Skin:  Intact without significant lesions or rashes. Cervical Nodes:  No significant cervical adenopathy. Psych:  Flat affect   Impression & Recommendations:  Problem # 1:  DIARRHEA (ICD-787.91) Assessment Improved Stools on loose side since cholecystectomy but recently had increased frequency of loose stool which has responded nicely to Flagyl.  Problem # 2:  ANOREXIA (ICD-783.0) Assessment: Deteriorated Associated with significant weight loss over last several weeks. Her nausea and diarrhea have significantly improved but appetitie is lacking. Continue Ensure. We spoke about importance of good nutrition in healing process. No signs of dehydration at present but  I don't think it would take much to get there. Patient's niece is present and plans to encourge meals. I asked the niece to monitor patient's oral intake / weight.  Will follow up with Dr. Arlyce Dice early [place].  Problem # 3:  NAUSEA (ICD-787.02) Assessment: Improved None in several days. Nausea improved along with diarrhea after starting Flagyl.  Patient Instructions: 1)  We made you a follow up appointment with Dr. Melvia Heaps for 05/27/2010. Appointment card given. 2)  Drink Ensure or Boost 3 times daily. 3)  Try to start to eat starting with soups. Adviance to other foods as you can. 4)  Keep a log of your weight, daily. 5)  If you lose weight call us back and let us know.  6)  If you experience dizziness or start to feel  worse, go to the Emergency Room of either [facility] or [facility].  7)  Copy sent to : Criss Alvine, MD 8)  The medication list was reviewed and reconciled.  All changed / newly prescribed medications were explained.  A complete medication list was provided to the patient / caregiver.

## 2010-09-24 NOTE — Discharge Summary (Signed)
Summary: HOSPITAL DISCHARGE NOTE    NAME:  Stephanie Colon, Stephanie Colon                  ACCOUNT NO.:  [id number]      MEDICAL RECORD NO.:  [id number]          PATIENT TYPE:  INP      LOCATION:  1332                         FACILITY:  [facility]      PHYSICIAN:  Brendia Sacks, MD    DATE OF BIRTH:  1918-09-28      DATE OF ADMISSION:  11/03/2009   DATE OF DISCHARGE:                                  DISCHARGE SUMMARY      DATE OF DISCHARGE:  Pending.      CONDITION ON DISCHARGE:  Stable.      PRIMARY CARE PHYSICIAN:  Dr. Georgianne Fick.      DISCHARGE DIAGNOSES:   1. Acute gallstone pancreatitis.   2. Status post ERCP (endoscopic retrograde cholangiopancreatography)       with sphincterotomy.   3. Cholelithiasis.   4. Status post cholecystectomy.   5. Acute renal failure, resolved.   6. Intermittent rectal prolapse with bowel movements.   7. Hypertension, stable.   8. Normocytic anemia, stable.   9. Need for skilled rehab.      HISTORY OF PRESENT ILLNESS:   1. This is an 77 year old woman who presented to the emergency room       with nausea, abdominal pain and vomiting and was admitted for acute       pancreatitis.  She was treated with supportive care and seen in       consultation with gastroenterology.  ERCP was performed successfully with       sphincterotomy and stone extraction without apparent       complication.  Her pancreatitis has apparently resolved at this       point.   2. Cholelithiasis.  The patient underwent cholecystectomy March 25.   3. Acute renal failure.  This resolved with supportive care.   4. Chronic kidney disease stage III.  This has remained stable.   5. Hypertension.  This has remained stable.   6. Normocytic anemia.  This has remained stable.   7. Intermittent rectal prolapse.  This occurs with bowel movements and       spontaneously reduces.  Once the patient's condition has stabilized       in the outpatient setting suggest outpatient followup with surgery         for consideration of further treatment.   8. Need for skilled care.      CONSULTATIONS:   1. [facility].   2. [facility].      PROCEDURES:   1. ERCP with biliary sphincterotomy and common bile duct stone       extraction March 22.   2. Laparoscopic cholecystectomy with intraoperative cholangiogram       March 25.      MICROBIOLOGY:  None.      IMAGING:   1. Acute abdominal series March 14:  Nonobstructive bowel gas pattern,       no free air.   2. MRCP March 17:  Acute pancreatitis, distal common bile duct stones,  gallstones, diffuse pericholecystic fluid.   3. Intraoperative cholangiogram:  Negative.      LABORATORY STUDIES:   1. Lipase greater than 2000 on admission, most recently 28.   2. Hemoglobin A1c 6.3.   3. Lipid profile - total cholesterol 98, triglycerides 113, HDL 34,       LDL 41.   4. CBC - hemoglobin 9.8 and stable, normal white blood cell count and       platelet count on last check.   5. Basic metabolic panel notable for a creatinine of 1.22 and       estimated GFR 42.  This appears to be at the patient's baseline.      EXAMINATION:  March 27:  The patient complains of abdominal soreness,   she is not eating well today.   VITAL SIGNS:  Temperature is 99.4, pulse 83, respirations 21, blood   pressure 129/65, sat 92% on room air.   GENERAL:  She is well-developed.  She appears to be uncomfortable.  She   is nontoxic.   CARDIOVASCULAR:  Regular rate and rhythm.  No murmur, rub or gallop.   RESPIRATORY:  Clear to auscultation bilaterally.  No wheezes, rales or   rhonchi.  There is normal respiratory effort.   ABDOMEN:  Abdomen is soft, tender.   NEUROLOGICAL:  Speech is fluent and clear.      DISCHARGE INSTRUCTIONS:  Heart-healthy diet.      ACTIVITY:  Per physical therapy.  She will be going to a skilled   facility for rehab.      WOUND CARE:  As per surgery.      FOLLOWUP:  Follow up with Dr. Nicholos Johns in  about 2 weeks and with   [facility] as directed.      DISCHARGE MEDICATIONS:  To follow at time of discharge.      THINGS TO FOLLOW UP IN THE OUTPATIENT SETTING:   1. Normocytic anemia.   2. Notation of mildly elevated hemoglobin A1c as described above.  Of       note though fasting blood sugars throughout her hospitalization       have been less than 100.  Specifically less than 98.  Given her       advanced age and lack of significant hyperglycemia would not pursue       any treatment at this point.               Brendia Sacks, MD         DG/MEDQ  D:  11/16/2009  T:  11/16/2009  Job:  [id number]      cc:   Georgianne Fick, M.D.   Fax: [phone]      Electronically Signed by Brendia Sacks  on 11/16/2009 03:11:59 PM

## 2010-09-24 NOTE — Assessment & Plan Note (Signed)
Summary: 3-4 WK FU---STC   Vital Signs:  Patient profile:   77 year old female Height:      64.5 inches (163.83 cm) Weight:      122.2 pounds (55.55 kg) O2 Sat:      96 % on Room air Temp:     98.1 degrees F (36.72 degrees C) oral Pulse rate:   86 / minute BP sitting:   132 / 78  (left arm) Cuff size:   regular  Vitals Entered By: Orlan Leavens (March 05, 2010 10:52 AM)  O2 Flow:  Room air CC: 3 week follow-up Is Patient Diabetic? No Pain Assessment Patient in pain? no      Comments Pt states after she started taking 2 paroxetine started having some hair loss. Also around 11 oclock she starts to get sleepy. ? if taking 2 pills is too much   Primary Care Provider:  Newt Lukes MD  CC:  3 week follow-up.  History of Present Illness: here for followup anxiety -  improved with addition of generic paxil -reports compliance with ongoing medical treatment and no changes in medication dose or frequency. denies adverse side effects related to current therapy.   also reviewed prior OV: 1) recent hosp 10/2009 for bilary pancreatitis - dc summary and labs reviewed -  still recovering from surg but feeling well - no abd pain or trouble with by mouth intake -  no diarrhea or n/v, no anorexia or weight loss - still holding hctz and statin from hosp for same  2) HTN - reports compliance with ongoing medical treatment and no changes in medication dose or frequency. denies adverse side effects related to current therapy. no CP, edema, HA or vision changes  3) dyslipidemia- reports compliance with ongoing medical treatment and no changes in medication dose or frequency. denies adverse side effects related to current therapy. as above, off statin tx last 6 weeks since hosp due to panc but tol low dose simva for years w/o problems  4) c/o anxiety - long hx same, symptoms present > 2 yrs - gradual worsening of symptoms since illness -  no panic attacks or insomnia, just "nervous all the  time" never on meds for same and declines meds that might cause sedation or "addiction"  Current Medications (verified): 1)  Triamterene-Hctz 37.5-25 Mg Tabs (Triamterene-Hctz) .... Take 1/2 By Mouth Once Daily 2)  Temazepam 7.5 Mg Caps (Temazepam) .... Take 1 By Mouth At Bedtime As Needed 3)  Paroxetine Hcl 12.5 Mg Xr24h-Tab (Paroxetine Hcl) .Marland Kitchen.. 1 By Mouth Once Daily X 2 [place], Then Increase To 2 By Mouth Once Daily  (Or As Directed)  Allergies (verified): 1)  ! Celexa 2)  ! Aspirin 3)  ! Monopril 4)  ! * Hyddromorphone  Past History:  Past Medical History: Hyperlipidemia Hypertension Asthma Colonic polyps, hx of anxiety  Review of Systems  The patient denies weight loss, chest pain, dyspnea on exertion, peripheral edema, and depression.    Physical Exam  General:  alert, well-developed, well-nourished, and cooperative to examination.   young appearing for stated age Lungs:  normal respiratory effort, no intercostal retractions or use of accessory muscles; normal breath sounds bilaterally - no crackles and no wheezes.    Heart:  normal rate, regular rhythm, no murmur, and no rub. BLE without edema. normal DP pulses and normal cap refill in all 4 extremities    Psych:  Oriented X3, memory intact for recent and remote, normally interactive, good eye contact,  not anxious appearing, not depressed appearing, and not agitated.      Impression & Recommendations:  Problem # 1:  ANXIETY (ICD-300.00) Assessment Improved long hx same now doing well on lpow dose tx - cont same - 90d rx done  Her updated medication list for this problem includes:    Paroxetine Hcl 12.5 Mg Xr24h-tab (Paroxetine hcl) .Marland Kitchen... 1 by mouth once daily  Problem # 2:  HYPERLIPIDEMIA (ICD-272.4)  labs reviewed - cont to hold simva d/t pancreatitis 10/2009 hosp recheck FLP in 04/2010  Labs Reviewed: SGOT: 16 (11/17/2009)   SGPT: 15 (11/17/2009)   HDL:34 (11/06/2009), 49 (05/05/2009)  LDL:41 (11/06/2009), 30  (82/95/6213)  Chol:98 (11/06/2009), 95 (05/05/2009)  Trig:113 (11/06/2009), 80 (05/05/2009)  Problem # 3:  HYPERTENSION (ICD-401.9)  Her updated medication list for this problem includes:    Triamterene-hctz 37.5-25 Mg Tabs (Triamterene-hctz) .Marland Kitchen... Take 1/2 by mouth once daily  BP today: 132/78 Prior BP: 132/70 (02/02/2010)  Labs Reviewed: K+: 4.0 (11/17/2009) Creat: : 1.23 (11/17/2009)   Chol: 98 (11/06/2009)   HDL: 34 (11/06/2009)   LDL: 41 (11/06/2009)   TG: 113 (11/06/2009)  Complete Medication List: 1)  Triamterene-hctz 37.5-25 Mg Tabs (Triamterene-hctz) .... Take 1/2 by mouth once daily 2)  Temazepam 7.5 Mg Caps (Temazepam) .... Take 1 by mouth at bedtime as needed 3)  Paroxetine Hcl 12.5 Mg Xr24h-tab (Paroxetine hcl) .Marland Kitchen.. 1 by mouth once daily  Patient Instructions: 1)  it was good to see you today. 2)  continue the Paxil xr 12.5mg  once daily - [facility] 3)  contiue off simvastatin for now - 4)  Please schedule a follow-up appointment in September, sooner if problems. will check cholesterol at that visit so come to appointment fasting Prescriptions: PAROXETINE HCL 12.5 MG XR24H-TAB (PAROXETINE HCL) 1 by mouth once daily  #90 x 3   Entered and Authorized by:   Newt Lukes MD   Signed by:   Newt Lukes MD on 03/05/2010   Method used:   Faxed to ...       [facility])             , [place]         Ph: [phone]       Fax: [phone]   RxID:   [id number]

## 2010-09-24 NOTE — Progress Notes (Signed)
Summary: Diarrhea  Phone Note From Other Clinic   Caller: Debra @ Dr Felicity Coyer [phone] Call For: Dr Arlyce Dice  Reason for Call: Schedule Patient Appt Summary of Call: Diarrhea x2days- Would like pt seen sooner than next available. Had a Colon last year with Dr Arlyce Dice. I did not seen any office visits at all - not even in Med Mgr. Initial call taken by: Leanor Kail [facility],  May 04, 2010 9:43 AM  Follow-up for Phone Call        Pt. will see Willette Cluster NP on 05-06-10 at 10:30am. Stanton Kidney will advise pt. of med.list/co-pay/cx.policy.  Follow-up by: Laureen Ochs LPN,  May 04, 2010 10:20 AM

## 2010-09-24 NOTE — Discharge Summary (Signed)
Summary: DISCHARGE NOTE ADDENDUM    NAMEKONSTANCE, Colon                  ACCOUNT NO.:  [id number]      MEDICAL RECORD NO.:  [id number]          PATIENT TYPE:  INP      LOCATION:  1332                         FACILITY:  [facility]      PHYSICIAN:  Marcellus Scott, MD     DATE OF BIRTH:  30-Apr-1919      DATE OF ADMISSION:  11/03/2009   DATE OF DISCHARGE:  11/18/2009                                  DISCHARGE SUMMARY      ADDENDUM:      PRIMARY CARE PHYSICIAN:  Georgianne Fick, M.D.            This is an addendum to the discharge summary that was done by Dr. Brendia Sacks on November 16, 2009.  It will update events of this patient's   care since March 27 to date and the discharge medications.  Please refer   to Dr. Rene Kocher discharge summary from March 27 for detailed discharge   diagnosis, hospital course, consultations, procedures and activity.      DISCHARGE MEDICATIONS:   1. Amlodipine 10 mg p.o. daily.   2. Bisacodyl 10 mg suppositories per rectal daily p.r.n. for       constipation.   3. Hydrocodone/acetaminophen 5/325 mg tablet,  1-2 tablets p.o. q. 4       hourly p.r.n. for pain,.  15 tablets prescribed.   4. Ondansetron 4 mg p.o. q. 4 hourly p.r.n. for nausea.   5. Multivitamins one p.o. daily.   6. Refresher eyedrops 1 drop in left eye 3 times a day.   7. Simvastatin 20 mg p.o. at bedtime.      DISCONTINUED MEDICATION:  Hydrochlorothiazide.      HOSPITAL COURSE:  Hospital course since March 27.  The patient was complaining of bilateral lower extremity pain yesterday.   This was more superficial with tenderness to touch.  She has   some superficial bruises on her lower extremities but otherwise the   temperature and color looked normal.  Her peripheral pulsations were well   felt.   Bilateral lower extremity venous Dopplers were performed and were   negative for deep vein thrombosis or superficial vein thrombosis or   Baker's cyst.  Unfortunately, the patient was not  able to tolerate   arterial Dopplers.  Today she indicates that her pain in the legs is   better.  She also has eaten 100% of her breakfast.  She has had a bowel   movement.  She has mild soreness at the abdominal incisional site.   Surgeons have seen her today and cleared her for discharge.  At this   time the patient has stable vital signs and physical examination and is   stable for discharge to the skilled nursing facility for rehab.  I have   called and updated her care to Ms. Chales Abrahams who is the patient's niece   at the patient's request and answered all her questions.      FOLLOWUP RECOMMENDATIONS:   1. Repeat complete  blood count and complete metabolic panel 3-5 days       after discharge.   2. Follow up with Dr. Abigail Miyamoto in 2 weeks after discharge.       Nursing facility to make arrangements for such appointments.   3. Follow up with Dr. Nicholos Johns.  Nursing facility to make       arrangements for same.      Time taken in coordinating this discharge is 20 minutes.               Marcellus Scott, MD            AH/MEDQ  D:  11/18/2009  T:  11/18/2009  Job:  [id number]      cc:   Georgianne Fick, M.D.   Fax: [phone]      Abigail Miyamoto, M.D.   [address]   [place]   [place] [address]      Electronically Signed by Marcellus Scott MD on 11/27/2009 10:38:25 PM

## 2010-09-24 NOTE — Assessment & Plan Note (Signed)
Summary: 2 mo rov /nws   Vital Signs:  Patient profile:   77 year old female Height:      64.5 inches (163.83 cm) Weight:      115.12 pounds (52.33 kg) O2 Sat:      97 % on Room air Temp:     97.0 degrees F (36.11 degrees C) oral Pulse rate:   104 / minute BP sitting:   130 / 70  (left arm) Cuff size:   regular  Vitals Entered By: Orlan Leavens RMA (May 11, 2010 11:07 AM)  O2 Flow:  Room air CC: 2 month follow-up Is Patient Diabetic? No Pain Assessment Patient in pain? no      Comments Want flu shot   Primary Care Provider:  Newt Lukes MD  CC:  2 month follow-up.  History of Present Illness: recent diarrhea assoc with nausea improved w. flagyl - saw GI for same  anxiety -  improved with addition of generic paxil but still "nervous" per family member -reports compliance with ongoing medical treatment and no changes in medication dose or frequency. denies adverse side effects related to current therapy.   prior hosp 10/2009 for bilary pancreatitis -  feeling well until diarrhea began- no abd pain or trouble with by mouth intake -  no pain now as before- no anorexia or weight loss - still holding statin die to same  HTN - reports compliance with ongoing medical treatment and no changes in medication dose or frequency. denies adverse side effects related to current therapy. no CP, edema, HA or vision changes  dyslipidemia- reports compliance with ongoing medical treatment and no changes in medication dose or frequency. denies adverse side effects related to current therapy. as above, off statin tx since 10/2009 hosp for pancreatitis but tol low dose simva for years w/o problems    Current Medications (verified): 1)  Triamterene-Hctz 37.5-25 Mg Tabs (Triamterene-Hctz) .... Take 1/2 By Mouth Once Daily 2)  Temazepam 7.5 Mg Caps (Temazepam) .... Take 1 By Mouth At Bedtime As Needed 3)  Paroxetine Hcl 12.5 Mg Xr24h-Tab (Paroxetine Hcl) .Marland Kitchen.. 1 By Mouth Once  Daily 4)  Ondansetron 4 Mg Tbdp (Ondansetron) .Marland Kitchen.. 1 By Mouth Every 8 Hours As Needed For Nausea 5)  Promethazine Hcl 12.5 Mg Tabs (Promethazine Hcl) .Marland Kitchen.. 1 By Mouth Every 4 Hours As Needed For Nausea 6)  Align 4 Mg Caps (Probiotic Product) .... Take 1 Capsule Daily X 14 Days  Allergies (verified): 1)  ! Celexa 2)  ! Aspirin 3)  ! Monopril 4)  ! * Hyddromorphone  Past History:  Past Medical History: Hyperlipidemia - off statin since 10/2009 d/t panc Hypertension Asthma Colonic polyps, hx of anxiety pancreatitis - hosp 10/2009 Gallstones    MD roster: GI- kaplan  Review of Systems  The patient denies chest pain, syncope, headaches, and abdominal pain.    Physical Exam  General:  alert, well-developed, well-nourished, and cooperative to examination.   young appearing for stated age -dtr at side Lungs:  normal respiratory effort, no intercostal retractions or use of accessory muscles; normal breath sounds bilaterally - no crackles and no wheezes.    Heart:  normal rate, regular rhythm, no murmur, and no rub. BLE without edema.  Abdomen:  soft, non-tender, hyperactive bowel sounds, no distention; no masses and no appreciable hepatomegaly or splenomegaly.     Impression & Recommendations:  Problem # 1:  ANXIETY (ICD-300.00)  Her updated medication list for this problem includes:    Paroxetine  Hcl 25 Mg Xr24h-tab (Paroxetine hcl) .Marland Kitchen... 1 by mouth once daily  Orders: Prescription Created Electronically [phone])  long hx same now doing well on low dose tx - feels could benefit more if dose inc try inc dose now - 30d local - if tol well w/o adv SE, call in 90d - if prob, OV 2-4 weeks to discuss other tx options - pt/family agrees  Her updated medication list for this problem includes:    Paroxetine Hcl 12.5 Mg Xr24h-tab (Paroxetine hcl) .Marland Kitchen... 1 by mouth once daily  Problem # 2:  HYPERLIPIDEMIA (ICD-272.4)  Orders: TLB-Lipid Panel (80061-LIPID)  holding simva d/t  pancreatitis 10/2009 hosp ?need resumed - check lipids now to help eval same  Labs Reviewed: SGOT: 22 (05/01/2010)   SGPT: 14 (05/01/2010)   HDL:34 (11/06/2009), 49 (05/05/2009)  LDL:41 (11/06/2009), 30 (05/05/2009)  Chol:98 (11/06/2009), 95 (05/05/2009)  Trig:113 (11/06/2009), 80 (05/05/2009)  Problem # 3:  HYPERTENSION (ICD-401.9)  Her updated medication list for this problem includes:    Triamterene-hctz 37.5-25 Mg Tabs (Triamterene-hctz) .Marland Kitchen... Take 1/2 by mouth once daily  BP today: 130/70 Prior BP: 100/56 (05/06/2010)  Labs Reviewed: K+: 3.7 (05/01/2010) Creat: : 1.3 (05/01/2010)   Chol: 98 (11/06/2009)   HDL: 34 (11/06/2009)   LDL: 41 (11/06/2009)   TG: 113 (11/06/2009)  Problem # 4:  DIARRHEA (ICD-787.91) Assessment: Improved  Her updated medication list for this problem includes:    Align 4 Mg Caps (Probiotic product) .Marland Kitchen... Take 1 capsule daily x 14 days  Stools on loose side since cholecystectomy but recently had increased frequency of loose stool which has responded nicely to Flagyl. f/u GI 05/27/10 as planned  Complete Medication List: 1)  Triamterene-hctz 37.5-25 Mg Tabs (Triamterene-hctz) .... Take 1/2 by mouth once daily 2)  Temazepam 7.5 Mg Caps (Temazepam) .... Take 1 by mouth at bedtime as needed 3)  Paroxetine Hcl 25 Mg Xr24h-tab (Paroxetine hcl) .Marland Kitchen.. 1 by mouth once daily 4)  Ondansetron 4 Mg Tbdp (Ondansetron) .Marland Kitchen.. 1 by mouth every 8 hours as needed for nausea 5)  Promethazine Hcl 12.5 Mg Tabs (Promethazine hcl) .Marland Kitchen.. 1 by mouth every 4 hours as needed for nausea 6)  Align 4 Mg Caps (Probiotic product) .... Take 1 capsule daily x 14 days  Other Orders: Flu Vaccine 14yrs + (78469) Administration Flu vaccine - MCR (G2952) Flu Vaccine Consent Questions     Do you have a history of severe allergic reactions to this vaccine? no    Any prior history of allergic reactions to egg and/or gelatin? no    Do you have a sensitivity to the preservative Thimersol? no    Do  you have a past history of Guillan-Barre Syndrome? no    Do you currently have an acute febrile illness? no    Have you ever had a severe reaction to latex? no    Vaccine information given and explained to patient? yes    Are you currently pregnant? no    Lot Number:[id number]   Exp Date:02/19/2010   Site Given  Left Deltoid IM Administration Flu vaccine - MCR ([id number])  Patient Instructions: 1)  it was good to see you today. 2)  test(s) ordered today - your results will be posted on the phone tree for review in 48-72 hours from the time of test completion; call [phone] and enter your 9 digit MRN (listed above on this page, just below your name); if any changes need to be made or there are abnormal results, you will  be contacted directly. 3)  increase paroxetine for anxiety and nervous symptoms - your prescription has been electronically submitted to your pharmacy. Please take as directed. Contact our office if you believe you're having problems with the medication(s).  4)  If you are doing well with this  medication change, call  in 2 weeks and we will sent 90d prescription for same - if not doing well (side effects, etc), make appointment to discuss the treatment options 5)  Please schedule a follow-up appointment in 3-4 months to review blood pressure and anxiety, call sooner if problems.  Prescriptions: PAROXETINE HCL 25 MG XR24H-TAB (PAROXETINE HCL) 1 by mouth once daily  #30 x 1   Entered and Authorized by:   Newt Lukes MD   Signed by:   Newt Lukes MD on 05/11/2010   Method used:   Electronically to        [facility] (retail)       [address]       [place], [place]  [address]       Ph: [phone] or [phone]       Fax: [phone]   RxID:   [id number]  .lbmedflu

## 2010-09-24 NOTE — Progress Notes (Signed)
Summary: ? whose paTIENT  Phone Note Outgoing Call   Summary of Call: jen, pls call Care mgt [phone]. They say thAT THIS PATINET IS MY OFFICE PATIENT AND [facility] TOOK CARE OF THEY PATIENBT. sOUNDS LIKE FYI.  I have not seen patient in office. Maybe in icu or pulm cx in hospital. If they need me to see patient, pls set up fu Initial call taken by: Kalman Shan MD,  November 21, 2009 6:04 PM  Follow-up for Phone Call        Spoke with Case Management adn look slike pt PMD is Dr. Nicholos Johns and paper was sent to [place] in mistake, she doe snot need a follow-up with MR. Carron Curie CMA  November 27, 2009 3:56 PM

## 2010-11-16 LAB — COMPREHENSIVE METABOLIC PANEL
ALT: 129 U/L — ABNORMAL HIGH (ref 0–35)
ALT: 15 U/L (ref 0–35)
ALT: 74 U/L — ABNORMAL HIGH (ref 0–35)
AST: 15 U/L (ref 0–37)
AST: 39 U/L — ABNORMAL HIGH (ref 0–37)
Albumin: 2.2 g/dL — ABNORMAL LOW (ref 3.5–5.2)
Albumin: 2.4 g/dL — ABNORMAL LOW (ref 3.5–5.2)
Albumin: 2.5 g/dL — ABNORMAL LOW (ref 3.5–5.2)
Alkaline Phosphatase: 137 U/L — ABNORMAL HIGH (ref 39–117)
Alkaline Phosphatase: 188 U/L — ABNORMAL HIGH (ref 39–117)
Alkaline Phosphatase: 92 U/L (ref 39–117)
BUN: 11 mg/dL (ref 6–23)
BUN: 4 mg/dL — ABNORMAL LOW (ref 6–23)
BUN: 5 mg/dL — ABNORMAL LOW (ref 6–23)
CO2: 18 mEq/L — ABNORMAL LOW (ref 19–32)
CO2: 24 mEq/L (ref 19–32)
CO2: 25 mEq/L (ref 19–32)
Calcium: 8.5 mg/dL (ref 8.4–10.5)
Calcium: 8.7 mg/dL (ref 8.4–10.5)
Calcium: 8.7 mg/dL (ref 8.4–10.5)
Calcium: 9.2 mg/dL (ref 8.4–10.5)
Chloride: 105 mEq/L (ref 96–112)
Chloride: 106 mEq/L (ref 96–112)
Chloride: 112 mEq/L (ref 96–112)
Creatinine, Ser: 1.15 mg/dL (ref 0.4–1.2)
Creatinine, Ser: 1.19 mg/dL (ref 0.4–1.2)
Creatinine, Ser: 1.33 mg/dL — ABNORMAL HIGH (ref 0.4–1.2)
GFR calc Af Amer: 46 mL/min — ABNORMAL LOW (ref >60)
GFR calc non Af Amer: 43 mL/min — ABNORMAL LOW (ref >60)
GFR calc non Af Amer: 45 mL/min — ABNORMAL LOW (ref >60)
Glucose, Bld: 106 mg/dL — ABNORMAL HIGH (ref 70–99)
Glucose, Bld: 108 mg/dL — ABNORMAL HIGH (ref 70–99)
Glucose, Bld: 118 mg/dL — ABNORMAL HIGH (ref 70–99)
Glucose, Bld: 158 mg/dL — ABNORMAL HIGH (ref 70–99)
Potassium: 3.3 mEq/L — ABNORMAL LOW (ref 3.5–5.1)
Potassium: 4 mEq/L (ref 3.5–5.1)
Potassium: 4.1 mEq/L (ref 3.5–5.1)
Sodium: 135 mEq/L (ref 135–145)
Sodium: 140 mEq/L (ref 135–145)
Sodium: 141 mEq/L (ref 135–145)
Total Bilirubin: 0.6 mg/dL (ref 0.3–1.2)
Total Bilirubin: 0.9 mg/dL (ref 0.3–1.2)
Total Protein: 5.6 g/dL — ABNORMAL LOW (ref 6.0–8.3)
Total Protein: 5.7 g/dL — ABNORMAL LOW (ref 6.0–8.3)
Total Protein: 5.7 g/dL — ABNORMAL LOW (ref 6.0–8.3)
Total Protein: 6 g/dL (ref 6.0–8.3)

## 2010-11-16 LAB — DIFFERENTIAL
Basophils Absolute: 0.1 10*3/uL (ref 0.0–0.1)
Basophils Relative: 1 % (ref 0–1)
Eosinophils Relative: 0 % (ref 0–5)
Monocytes Absolute: 0.7 10*3/uL (ref 0.1–1.0)

## 2010-11-16 LAB — GLUCOSE, CAPILLARY
Glucose-Capillary: 183 mg/dL — ABNORMAL HIGH (ref 70–99)
Glucose-Capillary: 37 mg/dL — CL (ref 70–99)
Glucose-Capillary: 67 mg/dL — ABNORMAL LOW (ref 70–99)

## 2010-11-16 LAB — COMPREHENSIVE METABOLIC PANEL WITH GFR
ALT: 265 U/L — ABNORMAL HIGH (ref 0–35)
ALT: 466 U/L — ABNORMAL HIGH (ref 0–35)
AST: 195 U/L — ABNORMAL HIGH (ref 0–37)
AST: 545 U/L — ABNORMAL HIGH (ref 0–37)
Albumin: 2.7 g/dL — ABNORMAL LOW (ref 3.5–5.2)
Albumin: 3.6 g/dL (ref 3.5–5.2)
Alkaline Phosphatase: 272 U/L — ABNORMAL HIGH (ref 39–117)
Alkaline Phosphatase: 343 U/L — ABNORMAL HIGH (ref 39–117)
BUN: 20 mg/dL (ref 6–23)
BUN: 22 mg/dL (ref 6–23)
CO2: 23 meq/L (ref 19–32)
CO2: 23 meq/L (ref 19–32)
Calcium: 8.7 mg/dL (ref 8.4–10.5)
Calcium: 9.6 mg/dL (ref 8.4–10.5)
Chloride: 102 meq/L (ref 96–112)
Chloride: 110 meq/L (ref 96–112)
Creatinine, Ser: 1.42 mg/dL — ABNORMAL HIGH (ref 0.4–1.2)
Creatinine, Ser: 1.63 mg/dL — ABNORMAL HIGH (ref 0.4–1.2)
GFR calc non Af Amer: 30 mL/min — ABNORMAL LOW
GFR calc non Af Amer: 35 mL/min — ABNORMAL LOW
Glucose, Bld: 199 mg/dL — ABNORMAL HIGH (ref 70–99)
Glucose, Bld: 82 mg/dL (ref 70–99)
Potassium: 3.3 meq/L — ABNORMAL LOW (ref 3.5–5.1)
Potassium: 4.3 meq/L (ref 3.5–5.1)
Sodium: 136 meq/L (ref 135–145)
Sodium: 139 meq/L (ref 135–145)
Total Bilirubin: 2 mg/dL — ABNORMAL HIGH (ref 0.3–1.2)
Total Bilirubin: 2.8 mg/dL — ABNORMAL HIGH (ref 0.3–1.2)
Total Protein: 6.2 g/dL (ref 6.0–8.3)
Total Protein: 7.5 g/dL (ref 6.0–8.3)

## 2010-11-16 LAB — URINALYSIS, ROUTINE W REFLEX MICROSCOPIC
Glucose, UA: NEGATIVE mg/dL
Ketones, ur: NEGATIVE mg/dL
Leukocytes, UA: NEGATIVE
pH: 6.5 (ref 5.0–8.0)

## 2010-11-16 LAB — CBC
HCT: 30 % — ABNORMAL LOW (ref 36.0–46.0)
HCT: 30.2 % — ABNORMAL LOW (ref 36.0–46.0)
HCT: 30.7 % — ABNORMAL LOW (ref 36.0–46.0)
HCT: 31.5 % — ABNORMAL LOW (ref 36.0–46.0)
HCT: 32.4 % — ABNORMAL LOW (ref 36.0–46.0)
HCT: 36.7 % (ref 36.0–46.0)
Hemoglobin: 10.4 g/dL — ABNORMAL LOW (ref 12.0–15.0)
Hemoglobin: 11.8 g/dL — ABNORMAL LOW (ref 12.0–15.0)
Hemoglobin: 9.9 g/dL — ABNORMAL LOW (ref 12.0–15.0)
MCHC: 31.4 g/dL (ref 30.0–36.0)
MCHC: 32 g/dL (ref 30.0–36.0)
MCHC: 32.6 g/dL (ref 30.0–36.0)
MCHC: 33 g/dL (ref 30.0–36.0)
MCHC: 34.7 g/dL (ref 30.0–36.0)
MCV: 95.9 fL (ref 78.0–100.0)
MCV: 97.4 fL (ref 78.0–100.0)
MCV: 98.2 fL (ref 78.0–100.0)
MCV: 98.2 fL (ref 78.0–100.0)
MCV: 98.4 fL (ref 78.0–100.0)
Platelets: 239 K/uL (ref 150–400)
Platelets: 269 10*3/uL (ref 150–400)
Platelets: 330 K/uL (ref 150–400)
RBC: 3.15 MIL/uL — ABNORMAL LOW (ref 3.87–5.11)
RBC: 3.15 MIL/uL — ABNORMAL LOW (ref 3.87–5.11)
RBC: 3.2 MIL/uL — ABNORMAL LOW (ref 3.87–5.11)
RBC: 3.38 MIL/uL — ABNORMAL LOW (ref 3.87–5.11)
RBC: 3.74 MIL/uL — ABNORMAL LOW (ref 3.87–5.11)
RDW: 13.1 % (ref 11.5–15.5)
RDW: 13.3 % (ref 11.5–15.5)
RDW: 13.4 % (ref 11.5–15.5)
RDW: 14.1 % (ref 11.5–15.5)
WBC: 10.3 K/uL (ref 4.0–10.5)
WBC: 4 K/uL (ref 4.0–10.5)
WBC: 5.5 10*3/uL (ref 4.0–10.5)
WBC: 7.6 10*3/uL (ref 4.0–10.5)
WBC: 8.8 10*3/uL (ref 4.0–10.5)

## 2010-11-16 LAB — BASIC METABOLIC PANEL
BUN: 10 mg/dL (ref 6–23)
BUN: 8 mg/dL (ref 6–23)
CO2: 19 mEq/L (ref 19–32)
CO2: 21 mEq/L (ref 19–32)
CO2: 24 mEq/L (ref 19–32)
Calcium: 8.4 mg/dL (ref 8.4–10.5)
Chloride: 109 mEq/L (ref 96–112)
Chloride: 110 mEq/L (ref 96–112)
Chloride: 114 mEq/L — ABNORMAL HIGH (ref 96–112)
Chloride: 115 mEq/L — ABNORMAL HIGH (ref 96–112)
Creatinine, Ser: 1.07 mg/dL (ref 0.4–1.2)
Creatinine, Ser: 1.22 mg/dL — ABNORMAL HIGH (ref 0.4–1.2)
GFR calc Af Amer: 50 mL/min — ABNORMAL LOW (ref >60)
GFR calc Af Amer: 51 mL/min — ABNORMAL LOW (ref >60)
GFR calc Af Amer: 59 mL/min — ABNORMAL LOW (ref >60)
GFR calc non Af Amer: 37 mL/min — ABNORMAL LOW (ref >60)
GFR calc non Af Amer: 43 mL/min — ABNORMAL LOW (ref >60)
Glucose, Bld: 148 mg/dL — ABNORMAL HIGH (ref 70–99)
Glucose, Bld: 87 mg/dL (ref 70–99)
Glucose, Bld: 95 mg/dL (ref 70–99)
Potassium: 2.9 mEq/L — ABNORMAL LOW (ref 3.5–5.1)
Potassium: 3.3 mEq/L — ABNORMAL LOW (ref 3.5–5.1)
Potassium: 3.7 mEq/L (ref 3.5–5.1)
Potassium: 3.9 mEq/L (ref 3.5–5.1)
Potassium: 5.1 mEq/L (ref 3.5–5.1)
Sodium: 136 mEq/L (ref 135–145)
Sodium: 140 mEq/L (ref 135–145)
Sodium: 141 mEq/L (ref 135–145)

## 2010-11-16 LAB — LIPASE, BLOOD
Lipase: 21 U/L (ref 11–59)
Lipase: 64 U/L — ABNORMAL HIGH (ref 11–59)
Lipase: 76 U/L — ABNORMAL HIGH (ref 11–59)
Lipase: 86 U/L — ABNORMAL HIGH (ref 11–59)

## 2010-11-16 LAB — URINE MICROSCOPIC-ADD ON

## 2010-11-16 LAB — BASIC METABOLIC PANEL WITH GFR
BUN: 24 mg/dL — ABNORMAL HIGH (ref 6–23)
CO2: 16 meq/L — ABNORMAL LOW (ref 19–32)
Calcium: 8.9 mg/dL (ref 8.4–10.5)
Chloride: 112 meq/L (ref 96–112)
Creatinine, Ser: 1.71 mg/dL — ABNORMAL HIGH (ref 0.4–1.2)
GFR calc non Af Amer: 28 mL/min — ABNORMAL LOW
Glucose, Bld: 41 mg/dL — CL (ref 70–99)
Potassium: 4.5 meq/L (ref 3.5–5.1)
Sodium: 138 meq/L (ref 135–145)

## 2010-11-16 LAB — LIPID PANEL
Cholesterol: 98 mg/dL (ref 0–200)
Total CHOL/HDL Ratio: 2.9 RATIO
VLDL: 23 mg/dL (ref 0–40)

## 2010-11-16 LAB — MAGNESIUM
Magnesium: 1.5 mg/dL (ref 1.5–2.5)
Magnesium: 2.2 mg/dL (ref 1.5–2.5)

## 2010-11-27 LAB — CBC
HCT: 31 % — ABNORMAL LOW (ref 36.0–46.0)
HCT: 32.4 % — ABNORMAL LOW (ref 36.0–46.0)
HCT: 37.8 % (ref 36.0–46.0)
Hemoglobin: 11 g/dL — ABNORMAL LOW (ref 12.0–15.0)
MCV: 96.1 fL (ref 78.0–100.0)
MCV: 97.1 fL (ref 78.0–100.0)
Platelets: 209 10*3/uL (ref 150–400)
Platelets: 260 10*3/uL (ref 150–400)
RBC: 3.33 MIL/uL — ABNORMAL LOW (ref 3.87–5.11)
RDW: 13.3 % (ref 11.5–15.5)
WBC: 5.9 10*3/uL (ref 4.0–10.5)
WBC: 8.3 10*3/uL (ref 4.0–10.5)

## 2010-11-27 LAB — PHOSPHORUS: Phosphorus: 1.9 mg/dL — ABNORMAL LOW (ref 2.3–4.6)

## 2010-11-27 LAB — LIPID PANEL
Cholesterol: 95 mg/dL (ref 0–200)
LDL Cholesterol: 30 mg/dL (ref 0–99)

## 2010-11-27 LAB — COMPREHENSIVE METABOLIC PANEL
AST: 27 U/L (ref 0–37)
Albumin: 4.1 g/dL (ref 3.5–5.2)
BUN: 22 mg/dL (ref 6–23)
BUN: 25 mg/dL — ABNORMAL HIGH (ref 6–23)
CO2: 23 mEq/L (ref 19–32)
Chloride: 107 mEq/L (ref 96–112)
Creatinine, Ser: 1.55 mg/dL — ABNORMAL HIGH (ref 0.4–1.2)
Creatinine, Ser: 1.61 mg/dL — ABNORMAL HIGH (ref 0.4–1.2)
GFR calc Af Amer: 37 mL/min — ABNORMAL LOW (ref >60)
GFR calc non Af Amer: 32 mL/min — ABNORMAL LOW (ref >60)
Potassium: 2.9 mEq/L — ABNORMAL LOW (ref 3.5–5.1)
Total Bilirubin: 0.6 mg/dL (ref 0.3–1.2)
Total Protein: 7.8 g/dL (ref 6.0–8.3)

## 2010-11-27 LAB — STOOL CULTURE

## 2010-11-27 LAB — CLOSTRIDIUM DIFFICILE EIA: C difficile Toxins A+B, EIA: NEGATIVE

## 2010-11-27 LAB — DIFFERENTIAL
Lymphocytes Relative: 5 % — ABNORMAL LOW (ref 12–46)
Monocytes Absolute: 0.8 10*3/uL (ref 0.1–1.0)
Monocytes Relative: 6 % (ref 3–12)
Neutro Abs: 12 10*3/uL — ABNORMAL HIGH (ref 1.7–7.7)

## 2010-11-27 LAB — MAGNESIUM: Magnesium: 2 mg/dL (ref 1.5–2.5)

## 2010-11-27 LAB — GIARDIA/CRYPTOSPORIDIUM SCREEN(EIA): Cryptosporidium Screen (EIA): NEGATIVE

## 2010-11-27 LAB — BASIC METABOLIC PANEL
BUN: 22 mg/dL (ref 6–23)
Calcium: 9.1 mg/dL (ref 8.4–10.5)
GFR calc non Af Amer: 39 mL/min — ABNORMAL LOW (ref >60)
Potassium: 4.3 mEq/L (ref 3.5–5.1)

## 2011-01-05 NOTE — H&P (Signed)
Stephanie Colon, Stephanie Colon                  ACCOUNT NO.:  [id number]   MEDICAL RECORD NO.:  [id number]          PATIENT TYPE:  INP   LOCATION:  5152                         FACILITY:  [facility]   PHYSICIAN:  Hillery Aldo, M.D.   DATE OF BIRTH:  1918/09/06   DATE OF ADMISSION:  03/14/2007  DATE OF DISCHARGE:                              HISTORY & PHYSICAL   PRIMARY CARE PHYSICIAN:  Dr. Nicholos Johns   CHIEF COMPLAINT:  I feel like I'm dying, diffuse body aches and pains,  weight loss, generalized failure to thrive.   HISTORY OF PRESENT ILLNESS:  The patient is an 78 year old female who  reports a 10-month history of generalized body aches, mainly centered  around her back and lower extremities.  She also has had hip and neck  pain.  The patient was sent for an MRI scan recently and was supposed to  follow up with her primary care physician later on this week, but  presented to the emergency department due to a deterioration in her  condition and severe pain.  The patient's pain is mainly centered around  the back, bilateral hips, and lower extremities.  She now has had  increased difficulty with ambulating and lives alone.  She also reports  a markedly diminished appetite and a significant weight loss.  She has  lost approximately 14 pounds in two weeks' time.  Review of her recent  MRI does show sacral insufficiency fractures as well as degenerative  disk disease.  She is being admitted for pain management and  consultation with interventional radiology for vertebroplasty.   PAST MEDICAL HISTORY:  1. Crohn's disease with history of bowel perforation and colon      resection.  2. Hypertension.  3. Vertigo.  4. Left TMJ.  5. History of right bundle-branch block.  6. History of left posterior fascicular block.  7. Peripheral vascular disease.  8. Hiatal hernia.  9. Glaucoma.  10.Macular degeneration.  11.Osteoporosis.  12.History of left salpingo-oophorectomy and partial right  oophorectomy.  13.Cataract and retinal surgeries.  14.Sacral insufficiency fracture.  15.Degenerative disk disease.  16.Hyperlipidemia.   FAMILY HISTORY:  The patient's father died an accidental death at the  age of 15.  The patient's mother died at 33 from an acute MI.  She has  two brothers who are deceased.  Both had Friedreich's ataxia.  One died  of prostate cancer and the other died of an acute MI.  He was also  diabetic.   SOCIAL HISTORY:  The patient is widowed and lives alone.  She is a  retired [employer] operational officer.  She has an extensive history of tobacco abuse,  approximately 90-pack-year history, and quit approximately 7 years ago.  She denies alcohol use.  She has no offspring.   ALLERGIES:  MONOPRIL and CELEXA.  She is intolerant to ASPIRIN and  NONSTEROIDAL ANTI-INFLAMMATORY medicines and had a bad episode of  epistaxis related to these.   MEDICATIONS:  1. Diazepam 5 mg b.i.d.  2. Hydrochlorothiazide 25 mg daily.  3. Percocet 7.5/750 q.8h.  4. Simvastatin 10 mg daily.  5. Travatan Z  one drop in the left eye q.h.s.  6. Saline nasal spray.   REVIEW OF SYSTEMS:  The patient denies any shortness of breath or chest  pain.  She has had some excessive thirst but no polyuria.  She has not  both moved her bowels in 4 days.  No bladder or dark tarry stools.  No  nausea or vomiting.  No abdominal pain.  She denies fever and chills.   PHYSICAL EXAMINATION:  Temperature 97.1, pulse 79, respirations 20,  blood pressure 138/76.  GENERAL:  This a frail, elderly female in no acute distress.  HEENT:  Normocephalic, atraumatic.  The patient's left eye is irregular  and the right eye is at 1 mm.  Her pupils do not react.  Extraocular  movements are intact.  Oropharynx is clear.  NECK:  Supple, no thyromegaly, no lymphadenopathy, no jugular venous  distension.  CHEST:  Lungs clear to auscultation bilaterally with good air movement.  HEART:  Regular rate and rhythm.  No murmurs, rubs, or  gallops.  ABDOMEN:  Soft.  She is fairly tender to palpation in the left lower  quadrant with some guarding.  Bowel sounds are positive.  EXTREMITIES:  No clubbing, edema, cyanosis.  SKIN:  Warm and dry.  No rashes.  NEUROLOGIC:  Cranial nerves II-XII grossly intact.  Unable to assess  pupil response due to prior surgery.  The patient has some diminished  strength bilaterally in her lower extremities.   DATA REVIEW:  Chest x-ray shows COPD with left basilar atelectasis  versus scarring and a probable calcified granuloma in the right upper  lobe.  This has been documented on prior films.   A 12-lead EKG shows normal sinus rhythm with an incomplete right bundle-  branch block.  There is some T-wave flattening in V2.  Rate is 72 beats  per minute.   Laboratory data:  White blood cell count 7.4, hemoglobin 13.4,  hematocrit 39.1, platelets 414 with an absolute neutrophil count of 4.6.  Sodium is 138, potassium 3.1, chloride 92, bicarb 34, BUN 29, creatinine  1.20, glucose 119, total protein 7.1, albumin 3.2, total bilirubin 0.5,  alkaline phosphatase 212, AST 22, ALT 14.  Point-of-care markers are  negative x1.  Urinalysis is negative for nitrites and leukocyte  esterase.   ASSESSMENT AND PLAN:  1. Pain secondary to sacral insufficiency fracture:  We will admit the      patient and administer pain medications.  Will obtain a      consultation with the interventional radiologist for consideration      of vertebroplasty.  We will also start her on calcitonin nasal      spray and calcium supplements.  2. Failure to thrive with weight loss:  This is certainly concerning      for an occult malignancy, especially given her extensive tobacco      history.  Would consider a CT scan of the chest and a bone scan to      evaluate for the possibility of an occult malignancy.  With her      Crohn's disease, colon cancer is also a consideration.  Would check      a CEA level and proceed with  diagnostic imaging.  3. Osteoporosis:  The patient has not been on any bone-building      therapies.  Will start her on calcitonin and calcium plus vitamin D      here in the hospital.  However, she will likely need a  bisphosphonate on discharge.  4. Hypertension:  Will continue the patient's usual home medications.  5. Crohn's disease:  The patient's Crohn disease not currently active.      Will monitor her closely.  She was fairly tender on abdominal exam.  6. Elevated alkaline phosphatase:  Suspect this is from a bone source      rather than from an obstructive issue with the biliary ducts.  We      will monitor this over time.  7. Hypokalemia:  Likely related to her treatment of      hydrochlorothiazide.  We will supplement.  8. Prophylaxis:  Initiate PAS hoses for DVT prophylaxis given the fact      that she will likely undergo vertebroplasty.  She also has a      history of severe epistaxis and would not want to put her on      Lovenox at this time.  Use a PPI for GI prophylaxis.      Hillery Aldo, M.D.  Electronically Signed     CR/MEDQ  D:  03/14/2007  T:  03/15/2007  Job:  [id number]   cc:   Georgianne Fick, M.D.

## 2011-01-05 NOTE — Discharge Summary (Signed)
NAMELAMONICA, TRUEBA                  ACCOUNT NO.:  [id number]   MEDICAL RECORD NO.:  [id number]          PATIENT TYPE:  INP   LOCATION:  5152                         FACILITY:  [facility]   PHYSICIAN:  Mobolaji B. Bakare, M.D.DATE OF BIRTH:  1919/03/24   DATE OF ADMISSION:  03/14/2007  DATE OF DISCHARGE:                               DISCHARGE SUMMARY   DATE OF DISCHARGE:  Pending.   DISPOSITION:  To skilled nursing facility in the next 1 to 2 days.   PROCEDURE:  1. CT angiogram of the chest was negative for pulmonary embolism. Non-      calcified large node in the right upper lobe measuring up to 1.6      cm, highly concerning for primary bronchogenic carcinoma.      Cholelithiasis with enhancement and mild wall thickening of the      gallbladder and cystic duct. An active inflammatory process such as      cholecystitis could not be excluded.  2. Nuclear bone scan showed sacral insufficiency fractures, probable      bony injury in the medial and right pubic bone.  3. Sacroplasty performed by Dr. Corliss Skains on March 28, 2007.  4. MRI of the cervical spine showed cervical spondylytic changes, most      notable C4-5, C6 level. Right upper lobe mass also noted.  5. PET scan showed no active uptake to suggest malignancy. Please see      report for details.   ADDENDUM HOSPITAL COURSE:  1. Sacral insufficiency. The patient had sacroplasty by Dr. Corliss Skains      on March 24, 2007. She was subsequently seen by physical therapist.      The patient was judged to be a candidate for skilled nursing      facility placement in the short term for rehabilitation. She lives      alone and she is a widow. Next of kin is out of town. She would      benefit from short stay skilled nursing facility placement for      rehabilitation until she improves and can go back home.  2. Benign right upper lobe nodule. This was reported on CT scan of the      chest. Recommendation was to have a PET scan. The PET scan  was done      and it showed that the pulmonary nodule does not exhibit any      abnormal FDG uptake, so therefore, likely representing a benign      lesion such as hematoma or non-calcified granuloma. However,      recommendation is to have a followup CT of the chest in 3 months to      assure stability. Of note is that patient mentioned she has had a      chest x-ray with pulmonary nodule in 1999 but further details could      not be provided. The patient will followup with her primary care      physician for clarity and if she does need a followup CT scan of  the chest in 3 months.  3. Allergic pruritic rash.  This has subsided. She would continue with      local hydrocortisone cream.  4. Cervical spondylosis. She had a MRI of the cervical spine and this      showed spondylosis involving C4-5 and C5-6. The patient is non-      symptomatic.  5. The CT scan of the chest reported gallbladder wall thickening. The      patient has no right upper quadrant tenderness, no leukocytosis or      fever to suggest cholecystitis. LFT's were normal. No further      investigation was warranted.   DISCHARGE MEDICATIONS:  Cacitonin 1 spray each 1 nostril daily, calcium  with vitamin D 1 tablet t.i.d., Benadryl 25 mg p.o. q.4 to 6 hours  p.r.n., hydrocortisone cream 1% apply b.i.d., Lexapro 10 mg daily,  hydrochlorothiazide 25 mg daily, saline nasal spray three times a day,  Travatan 0.004% 1 drop left eye q.h.s., Zocor 20 mg daily, Vicodin  7.5/750 1 tablet t.i.d. p.r.n., Diazepam 5 mg p.o. b.i.d., Chloraseptic  throat spray q.2 to 4 hours p.r.n.   LABORATORY DATA:  On discharge, sodium 138, potassium 3.8, chloride 103,  bicarb 29, glucose 85, BUN 11, creatinine 1.08. AST 18, ALT 24, albumin  2.1, white cells 9.4, hemoglobin 12.2, hematocrit 36.1, platelet  338,000. Normal differential Sed rate is 22, TSH 0.415.   The patient had inflamed pharyngitis during the course of  hospitalization. She  received Avelox and Chloraseptic spray. This  subsided and she is able to take more p.o. intake. She was seen by  speech therapist and recommendation is to have dysphagia 3 diet with  thin liquids and intermittent supervision with p.o. medications.   DIET:  Dysphagia 3 diet, 4 gram sodium restriction, thin liquids, and  intermittent supervision with p.o. pills, can be put in puree and large  pills can be crushed.      Mobolaji B. Corky Downs, M.D.  Electronically Signed     MBB/MEDQ  D:  03/26/2007  T:  03/26/2007  Job:  [id number]   cc:   Georgianne Fick, M.D.

## 2011-01-05 NOTE — Discharge Summary (Signed)
NAMECHARLESE, Stephanie Colon                  ACCOUNT NO.:  [id number]   MEDICAL RECORD NO.:  [id number]          PATIENT TYPE:  INP   LOCATION:  5152                         FACILITY:  [facility]   PHYSICIAN:  Madaline Savage, MD        DATE OF BIRTH:  [birth year]   DATE OF PROCEDURE:  DATE OF DISCHARGE:                    STAT - MUST CHANGE TO CORRECT WORK TYPE   Date of discharge yet to be determined.   Primary care physician is Dr. Nicholos Johns.   CURRENT DIAGNOSES:  1. Failure to thrive.  2. Sacral insufficiency fracture.  3. Odynophagia.  4. Depression.  5. History of Crohn disease.  6. Hypertension.  7. Macular degeneration.  8. Hyperlipidemia.   HISTORY OF PRESENT ILLNESS:  For a full history and physical, see the  history and physical dictated by Stephanie Colon on 03/14/2007.  In  short, Stephanie Colon is an 77 year old lady with multiple medical problems  who was admitted with a 69-month-history of generalized body aches mainly  in her back and lower extremities and decreased intake for the last few  days.  She had a recent MRA which showed sacral insufficiency fractures  and was admitted for sacroplasty and for management of her failure to  thrive.   PROBLEM LIST:  1. Sacral insufficiency fracture.  She does have a sacral      insufficiency fracture and we consulted Interventional Radiology to      do the sacroplasty.  She went down to Radiology on Friday for this      procedure but at that time she was noticed to have a reddish rash      on her body which is likely a drug rash, and so that was postponed      and is now being scheduled for 03/20/07.  At this time we are using      morphine for pain control, and her pain is well controlled at this      time.  2. Failure to thrive.  She says she has decreased appetite.  She also      complains of odynophagia for the last few days.  She says she      started having painful swallowing only after she was given pain      medications for her  back by Dr. Nicholos Johns.  On examination, her      throat is red.  She likely has an acute pharyngitis but she is      afebrile, her white count is within normal limits.  I will start      her on Avelox.  If she does not improve, we should consider a      fungal etiology for her odynophagia.  3. Failure to thrive.  She does have failure to thrive with poor      intake.  This is partly secondary to her painful swallowing.      Speech therapy has been seeing her in the hospital.  I have asked      the nurses to crush her medications because the whole medications      are  giving her trouble to swallow.  I discussed about Panda tube      feeding at this time but she would not want any tubes to be placed      at this time.  She says she has been eating well for the last few      days and wants to see if she improves.  4. Drug rash secondary, likely, to Dilaudid.  She had erythematous      confluent rash on her trunk, back and extremities which was noticed      on 03/17/07 when she went down for her sacroplasty.  She was      started on Dilaudid for pain while she was in the hospital.  She      was started on Benadryl and Solu-Medrol and in the last couple of      days her rash is almost disappeared.  I will DC her Solu-Medrol and      continue her Benadryl for 1 more day at this time.   DISPOSITION:  She will most likely need a skilled nursing home placement  at the time of discharge.  I have discussed it with her and she is in  agreement.  Please see the final dictation at the time of discharge for  a complete list of discharge diagnoses, discharge medications and  disposition.      Madaline Savage, MD  Electronically Signed     PKN/MEDQ  D:  03/19/2007  T:  03/19/2007  Job:  [id number]

## 2011-01-08 NOTE — Op Note (Signed)
[place]. [facility]  Patient:    Stephanie Colon, Stephanie Colon                         MRN: [id number] Proc. Date: 09/27/00 Adm. Date:  [id number] Attending:  Glenna Fellows Tapp[place]                           Operative Report  PREOPERATIVE DIAGNOSIS:  Nonhealing wound, abdominal wall.  POSTOPERATIVE DIAGNOSIS:  Nonhealing wound, abdominal wall.  OPERATION PERFORMED:  Excision and closure of nonhealing wound, abdominal wall.  SURGEON:  Lorne Skeens. Hoxworth, M.D.  ANESTHESIA:  Local with IV sedation.  INDICATIONS FOR PROCEDURE:  Stephanie Colon is a 77 year old white female who has undergone laparotomy and bowel resection approximately three to four months ago.  She now has a 3 x 2 cm open area of her midline abdominal wound just below the umbilicus with granulation tissue that has failed to improve with local wound care.  There is no evidence of infection.  Incision of closure has been recommended and accepted.  The nature of the procedure and risks of bleeding, infection and nonhealing were discussed and understood.  She is now brought to the operating room.  DESCRIPTION OF PROCEDURE:  The patient was brought to the operating room and placed in supine position on the operating table and IV sedation was administered.  The abdomen was sterilely prepped and draped.  Ancef 1 gm was given intravenously.  Local anesthesia was used to infiltrate the soft tissue around the wound.  The skin edges were then elliptically excised back to normal skin and subcutaneous tissue.  The wound was excised en bloc, sharply dissecting the granulation tissue off of the underlying fascia down to normal, healthy-appearing tissue and the wound was completely excised back to fresh tissue.  The underlying fascia was intact except for one small approximately 1 cm area where it was quite thin down to probably peritoneum and this was carefully closed with interrupted inverting 2-0 Prolene sutures.   Skin and subcutaneous tissues were then mobilized up off the fascia back about 1 cm in all directions sharply.  The wound was then closed with interrupted mattress sutures of 4-0 nylon under no tension.  Sponge, needle and instrument counts were correct.  Dry sterile dressings were applied.  The patient was taken to the recovery room in good condition. DD:  09/27/00 TD:  09/27/00 Job: [id number]

## 2011-01-08 NOTE — Discharge Summary (Signed)
[place]. [facility]  Patient:    Stephanie Colon, Stephanie Colon                         MRN: [id number] Adm. Date:  [id number] Disc. Date: 06/14/00 Attending:  Terald Sleeper Dictator:   Lorin Picket. Claiborne Billings, N.P. CC:         Lenon Curt. Chilton Si, M.D.  Sabino Gasser, M.D.  Chevis Pretty, M.D.   Discharge Summary  CHIEF COMPLAINT:  Weakness and gait instability admitted to subacute care unit following resection of terminal ileus due to small-bowel obstruction and perforation.  HISTORY OF PRESENT ILLNESS:  This elderly female was admitted May 22, 2000, after a one-week period of complaints of lower abdominal pain, anorexia, weight loss.  She was seen by Dr. Chilton Si on admission and was noted to require admission.  CT scan of the abdomen was done that did not show any abnormalities.  Day #1 post admission, suggested edematous distal terminal ileus loop with small-bowel obstruction and questionable perforation, went to surgery October 4, Dr. Carolynne Edouard.  Following recovery, the patient was transferred to the subacute care unit for continued physical and occupational therapy as well as wound care.  PAST MEDICAL HISTORY:  1. Hypertension.  2. Vertigo in 1985.  3. Left TMJ syndrome.  4. Right bundle branch block.  5. Left posterior fascicular block.  6. Palpations.  7. Bilateral femoral artery bruits.  8. Peripheral vascular disease, left leg.  9. Hypoesthesia, left leg. 10. Epistaxis. 11. Hiatal hernia. 12. Glaucoma. 13. Macular degeneration. 14. Abnormal chest x-ray in September 2000. 15. Osteoporosis.  PAST SURGICAL HISTORY:  1. Left salpingo-oophorectomy in 1957, portion of right ovary removed.  2. Tubes placed in ears in 1990.  3. Cataract removal in 1999, left eye.  4. Cataract removal in 1999, right eye.  5. Retinal and corneal surgery in 1999.  PREVIOUS PROCEDURES:  1. Ultrasound of abdomen in 1993, normal.  2. Upper GI series in November 1993 revealed hiatal  hernia.  3. Bone density scan in July 2001 revealed osteoporosis.  4. CT scan of the abdomen and pelvis September 2001 revealed renal cyst,     otherwise normal.  5. Abdominal views September 2001 found dilated small-bowel loops and     air/fluid level, only minimal air with in the colon.  Findings suspicious     for either small-bowel obstruction or severe ileus.  Followup abdominal     views May 25, 2000, showed slight improvement in partial small-bowel     obstruction.  Repeat views May 26, 2000, showed abdominal air/fluid     levels fewer though questioned free intraperitoneal versus bowel by right     lobe of liver.  6. CT scan of the abdomen on May 25, 2000, revealed dilated loops,     small-bowel with thickened wall noted right lower quadrant abdomen with     distal collapse of the colon.  Appendix not well visualized.  7. CT scan of the pelvis showed thickened wall segment that appeared to be     probably of the terminal ileus.  It appeared to be proximal to partial     distal small-bowel obstruction.  Colon appeared somewhat collapsed.     Diverticula within the sigmoid.  ADMISSION MEDICATIONS:  See admission History & Physical exam.  PHYSICAL EXAMINATION:  VITAL SIGNS:  Temperature 97.5, pulse 95, respirations 18, blood pressure 137/51.  GENERAL:  A frail-appearing, elderly female in no acute distress.  HEENT:  Mucous membranes pink and moist.  NECK:  Supple with no thyromegaly, adenopathy, or bruits.  BREASTS:  No discrete masses fibrocystic in nature.  HEART:  Regular rhythm and rate without murmur.  S3, S4.  Carotid pulses 3+.  LUNGS:  Clear to auscultation.  ABDOMEN:  Soft and nondistended with positive bowel sounds.  There is a healing incision with partial dehiscence at the proximal portion.  This is packed with gauze and no indication of erythema or increased warmth.  GENITOURINARY:  Normal female genitalia.  Bimanual exam deferred.  No CVA  or bladder pain.  RECTAL:  Normal sphincter tone.  BACK:  Normal with no CVA tenderness.  EXTREMITIES:  Moves all extremities x 4.  NEUROLOGIC:  The patient is alert, awake, oriented x 3.  Cranial nerves II-XII grossly intact.  No focal defects noted.  HOSPITAL COURSE:  The patient was admitted from [facility] following surgical repair by Dr. Carolynne Edouard on May 26, 2000, of small-bowel obstruction. The patient was transferred to the subacute care unit at [facility] on June 06, 2000, following surgical recovery, this for continued physical and occupational therapy as well as wound care.  She continued to be followed there by Dr. Carolynne Edouard.  There is noted a portion of the surgical incision that is dehisced in the proximal section measuring 4 cm in length.  This is currently packed and covered with gauze.  She continued with physical and occupational therapy and made excellent progress over the course of this subacute care unit stay.  Patient also reviewed by Dr. Sabino Gasser who noted that  pathology from resection portion of bowel suggested Crohns disease.  The patient was started on Asacol 800 mg twice daily.  The patient was given twice daily dressing changes over the course of rehabilitation stay.  This will now be reduced to once daily with Hydrogel.  Dressing changes will be done by home health nursing.  Will also discharge with three-in-one chair.  Will need to contact the office of Dr. Carolynne Edouard for surgical followup.  Should also have followup with Dr. Murray Hodgkins, Internal Medicine, [facility] in approximately two weeks post discharge with patient to please call and arrange this appointment as well.  There were some complaints of nausea on June 13, 2000, thought possibly secondary to new medication Asacol.  This has resolved by day of discharge.   CONDITION UPON DISCHARGE:  Stable.  DISCHARGE LABORATORY DATA:  Serum cholesterol 113,  prealbumin low at 14.3, triglycerides 66.  See [facility] Discharge Summary dated June 06, 2000, for further lab values.  DISCHARGE MEDICATIONS: 1. Topical nicoderm patch 21 mg for 24 hours, apply daily.  Following 30 days    of administration, would reduce to the next lower dose and follow the    program until discontinuance. 2. Prevacid 20 mg twice daily. 3. Alphagan ophthalmic solution 0.2 mg, 1 drop into the left eye twice daily. 4. Multivitamin with minerals daily. 5. Asacol 400 mg 2 tablets equalling 800 mg twice daily. 6. Acular 0.5% ophthalmic solution 1 drop into the left eye 4 times daily. 7. Rescula 1 drop into the left eye twice daily.  DISCHARGE DIAGNOSES: 1. Status post small-bowel obstruction with resection, Dr. Carolynne Edouard, May 26, 2000. 2. Crohns disease. 3. Gait instability and weakness following surgery, resolved with physical    and occupational therapy. 4. Nausea, resolved.  SPECIAL DISCHARGE INSTRUCTIONS: 1. The patient will require home health nursing for daily  Hydrogel dressing    changes to the abdominal wound until healed. 2. Will require home three-in-one chair. 3. Will require followup appointment with Dr. Carolynne Edouard, surgery, post discharge.    Patient is to please call and arrange. 4. Will require followup with Dr. Murray Hodgkins, Internal Medicine, [facility] approximately two weeks post discharge.  Patient is to    please call for appointment.  CONDITION UPON DISCHARGE:  Stable. DD:  06/14/00 TD:  06/14/00 Job: [id number]

## 2011-01-08 NOTE — Op Note (Signed)
[place]. [facility]  Patient:    Stephanie Colon, Stephanie Colon                         MRN: [id number] Proc. Date: 05/26/00 Adm. Date:  [id number] Attending:  Kimber Relic                           Operative Report  PREOPERATIVE DIAGNOSIS:  Small bowel obstruction.  POSTOPERATIVE DIAGNOSIS:  Distal small bowel perforation.  OPERATION PERFORMED:  Exploratory laparotomy, segmental resection of distalsmall bowel and proximal cecum with ileocolostomy.  SURGEON:  Chevis Pretty, M.D.  ASSISTANT:  Angelia Mould. Derrell Lolling, M.D.  ANESTHESIA:  General endotracheal.  DESCRIPTION OF PROCEDURE:  After informed consent was obtained, the patient was brought to the operating room and placed in the supine position on the operating table. After adequate induction of general anesthesia, the patients abdomen was prepped with Betadine and draped in the usual sterile manner.  A lower midline incision was made with a 10 blade knife.  This incision was carried down through the rest of the skin and subcutaneous tissue using the Bovie electrocautery until the fascia of the anterior abdominal wall was encountered.  The linea alba was identified and also incised with the bipolar electrocautery.  At this point the peritoneum was readily visible with free intra-abdominal air underneath.  The abdomen was entered by incising the peritoneum with the Bovie electrocautery allowing access to the abdominal cavity.  On initial exploration of the abdominal cavity there was a very adherent portion of small bowel stuck to the anterior abdominal wall in the right lower quadrant.  There was purulent free fluid in the abdomen.  Cultures were taken and sent for both bacteria and fungus.  The adhesions of the small bowel to the abdomen and the right lower quadrant were very dense.  Portions of these adhesions were able to be bluntly taken down with a finger fracture technique.  The rest required sharp dissection with  Metzenbaum scissors.  Two areas.  Two areas of perforation were readily identified within this adherent inflamed small bowel.  The right colon was mobilized by incising the peritoneal reflection along the white line of Toldt and elevating the right colon up into the wound.  Next a healthy portion of small bowel was chosen proximal to the affected area.  A small hole was made at the edge of the mesentery at this portion of small bowel chosen for transection.  The small bowel was then transected with a GIA 75 stapler.  A similar healthy portion of proximal right colon was also chosen and a small hole was made at its mesentery next to the bowel wall.  The right colon was then divided also with a GIA 75 stapler.  The mesentery between the divided portions of the small bowel was then scored wtih the Bovie electrocautery and then serially clamped, cut and ligated with 2-0 silk ties until the diseased portion of bowel was freed from its mesentery and removed from the patient.  The specimen was sent to pathology for further evaluation.  The abdomenw as then irrigated with copious amounts of warm saline in all four quadrants until the effluent was clear.  Next, the distal small bowel and proximal colon were placed in apposition to one another without difficulty in a longitudinal manner.  A 2-0 silk anchoring stitch was placed at each of the staple lines to further  align the antimesenteric borders of these two portions of bowel.  A small hole was made just adjacent to the staple lines on the antimesenteric border and each of these two portions of bowel and the GIA and each limb of the GIA was placed longitudinally on the bowel wall down the lumen of the bowel wall.  The GIA stapler was then approximated and approximation was checked prior to firing the stapler and dividing the edge of the bowel to create the anastomosis.  The two portions of bowel wall at the antimesenteric border were in  good apposition and the stapler was fired.  The GIA staple was removed and the remaining opening into the anastomosis was clamped with three Allis clamps to reapproximate the edges of the bowel wall.  A TA-60 stapler was then used to close the remaining portion of the anastomosis.  An anchoring stitch was used at the apex of this anastomosis to help relieve any tension on the staple line.  The bowel edges were healthy and well approximated.  The anastomosis was widely patent. The mesenteric defect was then closed with interrupted 3-0 silk sutures.  The bowel was then returned to the abdomen was again irrigated with copious amounts of saline.  The wounds were examined and found to be hemostatic.  The fascia was then closed with a running #1 PDS and the skin was reapproximated with staples.  The patient tolerated the procedure well.  At the end of the case all sponge, needle and instrument counts were correct. The patient was awakened and taken to the recovery room in stable condition. DD:  05/26/00 TD:  05/27/00 Job: [id number]

## 2011-01-08 NOTE — H&P (Signed)
[place]. [facility]  Patient:    Stephanie Colon, Stephanie Colon                         MRN: [id number] Adm. Date:  [id number] Attending:  Kimber Relic Dictator:   Stephanie [place], R.N., G.N.P.                         History and Physical  DATE OF BIRTH: 1919-01-11  CHIEF COMPLAINT: Weakness, with chills and diaphoresis x 1 week.  HISTORY OF PRESENT ILLNESS: This patient is a 77 year old patient of Dr. Lenon Curt. Green, who lives independently.  She was seen at the [facility] office today by Dr. Chilton Si with complaint of feeling weak, having chills, and sweating x 1 week.  She also reports a loss of appetite with an approximate six pound weight loss in the past week.  She reports she has been nauseated but has had no vomiting.  She was recently seen at [facility] by Dr. Chilton Si on May 11, 2000 with complaints of lower abdominal soreness and was scheduled for an abdominal and pelvic CT.  Both of these were done on May 13, 2000 and revealed no significant abnormalities.  Today she is being admitted to the hospital for further evaluation of her lower abdominal pain and weakness.  PAST MEDICAL HISTORY:  1. Hypertension.  2. Vertigo in 1985 secondary to labyrinthitis.  3. Left TMJ syndrome.  4. Right bundle branch block.  5. Left posterior fascicular block.  6. Heart palpitations.  7. Bilateral femoral artery bruits.  8. Peripheral vascular disease, left leg.  9. Hypoesthesia of left leg. 10. Epistaxis. 11. Hiatal hernia. 12. Glaucoma. 13. Macular degeneration. 14. Abnormal chest x-ray in September 2000 which revealed granuloma. 15. Osteoporosis diagnosed in July 2001.  PAST SURGICAL HISTORY:  1. Left salpingo-oophorectomy in 1957 and a portion of the right ovary     removed due to a cyst.  2. Tubes placed in ears in 1990.  3. Cataract removal in 1999 from left eye.  4. Cataract removal in 1999 from right eye.  5. Retinal and cornea surgery in 1999.  PAST  PROCEDURES:  1. Ultrasound of abdomen in 1993, which was normal.  2. Upper GI Series in November of 1993, which revealed a hiatal hernia.  3. Bone density in July 2001, which revealed osteoporosis.  4. CT of the abdomen and pelvis in September 2001 which revealed renal cyst,     otherwise normal.  CONSULTATIONS:  1. Jefry H. Pollyann Kennedy, M.D. (ENT).  2. Humberto Leep. Wyonia Hough, M.D. (orthopedics).  3. Alanson Aly. Roxan Hockey, M.D. (neurosurgery).  4. Chucky May, M.D. (ophthalmology).  5. Ernesto Rutherford, M.D. (ophthalmology).  ALLERGIES:  1. MONOPRIL.  2. CELEXA.  CURRENT MEDICATIONS: The patient states she has not been able to take any of her medications she was previously on.  1. Lorazepam 0.5 mg b.i.d.  2. Zantac 75 mg 1/2 tablet q.d.  3. Titralac 1 q.d.  4. Centrum 1 q.d.  5. Alphagan eyedrops 1 drop to left eye b.i.d.  6. Acular 1 drop to left eye q.i.d.  7. Rescula 1 drop to left eye b.i.d.  8. Calcium with vitamin D 1 q.d.  9. Ensure, one can q.d.  FAMILY HISTORY: Her father died an accidental death at age 48.  Mother died at age 12 with myocardial infarction.  She has two brothers, both deceased, both with history  of Friedreichs ataxia, one with cancer of the prostate with metastasis, and one with myocardial infarction and diabetes mellitus.  SOCIAL HISTORY: Her husband died in 02-19-1996 of CLL.  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  She currently smokes approximately 1-1/2 packs of cigarettes per day.  There is no history of ETOH use.  She currently lives alone independently.  REVIEW OF SYSTEMS: GENERAL: Positive for fever, chills, weight loss.  HEENT: She does wear prescription glasses.  She has no hearing loss.  She has no difficulty swallowing.  CARDIAC: She denies any chest pain or recent palpitations.  She does report some claudication with walking.  RESPIRATORY: She denies any shortness of breath.  She does have occasional cough which  is nonproductive.  GI: She does report some nausea, no vomiting.  She does report occasional constipation, no diarrhea.  GU: No complaints.  She does report some lower back and shoulder pain.  SKIN: Normal.  NEUROLOGIC: She does report occasional headaches.  No dizziness.  PSYCHIATRIC: Normal.  ENDOCRINE: The patient says she has a history of a thyroid problem and took iodine in the past.  HEMATOLOGIC/LYMPHATIC: Normal.  PHYSICAL EXAMINATION:  VITAL SIGNS: Temperature 97.5 degrees, pulse 95, respirations 18, blood pressure 137/51.  Height 66 inches.  Weight 100 pounds.  GENERAL: This is a thin, frail, elderly white female who is in no acute distress.  HEENT: Head normocephalic, atraumatic.  Right pupil is pinpoint in size but reactive to light, left pupil is status post partial iridectomy.  Sclerae and conjunctivae clear.  EOMI.  Tympanic membranes with normal landmarks.  Hearing normal.  Oropharynx without lesions.  Mucous membranes pink and moist.  She has torus palatitis noted.  NECK: Supple.  No thyromegaly, no adenopathy, no bruits.  BREAST: Without discrete masses.  Fibrocystic in nature.  HEART: Regular rate and rhythm.  No murmurs, rubs, or gallops.  PULSES: Carotids 3+, radials.  Femorals 3+ with bilateral bruits and a right palpable thrill.  She has 2+ dorsalis pedis pulses and posterior tibial pulses.  LUNGS: Clear to auscultation.  ABDOMEN: Soft, nondistended.  Positive bowel sounds.  She complains of generalized pain with palpation.  She does have bilateral iliac bruits noted.  GU: Normal external female genitalia.  Bimanual examination not performed.  RECTAL: Normal sphincter tone.  No stool noted in rectal vault.  Unable to obtain adequate specimen for Hemoccult.  BACK: Normal.  No CVA tenderness.  EXTREMITIES: Movement of extremities x 4.  No clubbing, cyanosis, or edema.  NEUROLOGIC: She is awake and alert and is oriented x 3.  Cranial nerves  2-12 grossly intact. DTRs 2+ upper and lower.  Strength 4/5 upper and lower.   SKIN: Seborrheic keratosis noted of face.  Lentigo of right cheek and forehead, otherwise no suspicious lesions.  No palpable endocervical, axillary, or inguinal.  LABORATORY DATA: On May 21, 2000 laboratories revealed a WBC of 15.8, RBC 3.50; hemoglobin 10.7; hematocrit 31; platelet count 613,000; neutrophils 82.4%, lymphocytes 9.8%.  Chemistries revealed a BUN of 27, otherwise normal. Urine culture was negative.  Chest x-ray revealed old granuloma of right upper chest, unchanged from prior x-rays; no other abnormalities noted.  IMPRESSION:  1. Abdominal pain with nausea.  2. Anemia.  PLAN: Will admit to [facility] to the service of Dr. Murray Hodgkins and will hydrate with intravenous fluids.  Clear liquid diet.  Will obtain laboratory work to include a CBC, CMP, amylase, lipase, B-12 and folic  acid levels. DD:  05/20/00 TD:  05/21/00 Job: [id number]

## 2011-01-08 NOTE — Consult Note (Signed)
[place]. [facility]  Patient:    Stephanie Colon, Stephanie Colon                         MRN: [id number] Adm. Date:  [id number] Attending:  Kimber Relic                          Consultation Report  HISTORY OF PRESENT ILLNESS:  Ms. Spampinato is a 77 year old lady who was admitted to the hospital by Dr. Frederik Pear for evaluation of weakness, chills, weight loss, anorexia.  The patient states right off that she really has lost her appetite back in 01/06/96 when her husband died.  He apparently had cancer and underwent radiation therapy.  Subsequent to that she states that her appetite never really returned, however, it had worsened over the last few months and over the last few weeks she states that she has also had abdominal discomfort and further anorexia with weight loss down to 100 pounds which is about 6 pounds in a week.  She states that she tries to eat, when she sees food she does not like to eat, but does and cannot wait until she finishes it.  She states that she has nausea, but has had no vomiting.  The pain may occur at any time during the day and we did not approach specifics on that at this point, but she states that she feels that her abdomen is quite hard and possibly distended, but she does think that when she eats, this makes this sensation feel better.  She also notes a sharp pain in the right lower quadrant of the abdomen up more to the right flank which is transient and stabbing in nature.  She states that her bowel movements have not changed. She has noticed blood per rectum once a few weeks ago, it was fairly copious in amount, but bright red and that has not been evaluated further at this point. The patients past history regarding this reveals laboratory studies that have been essentially negative except for a slightly lowered hematocrit which is normochromic normocytic and workup to this point of that has been unremarkable with normal B12 and folate levels  returned to the chart.  She has had a CAT scan of her abdomen and pelvis which was essentially unremarkable.  PAST MEDICAL HISTORY:  Notable for hypertension, vertigo in 01-06-1984, with TMJ syndrome, history of right bundle branch block, and history of palpitations. Apparently she has had femoral artery bruits in the past.  She has had macular degeneration glaucoma.  She has had epistaxis.  She has not been taking aspirin for at least two years, nor any other NSAIDs.  She takes Tylenol or aspirin-free Excedrin.  MEDICATIONS: 1. Lorazepam. 2. Zantac. 3. Ditrolac. 4. Centrum Silver. 5. Calcium. 6. Alphagan. 7. Ensure. 8. Accular. 9. Rescula.  ALLERGIES:  MONOPRIL, CELEXA.  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  She smokes about 1-1/2 packs of cigarettes per day.  She does not drink alcohol.  She lives alone.  REVIEW OF SYSTEMS:  As noted.  PHYSICAL EXAMINATION:  VITAL SIGNS:  Currently her vital signs are unremarkable.  Temperature 97.5 on admission, pulse 95, respiratory rate 18, blood pressure 137/51.  She weighs 100 pounds at 5 foot 6 inches tall.  GENERAL:  She is a thin-appearing female, slightly pale.  She  appears in no distress, somewhat anxious, and her affect is fairly flat, but she is well-spoken.  HEENT:  Otherwise unremarkable.  Thyroid is not enlarged.  No bruits were heard by me in the neck.  LUNGS:  Clear.  HEART:  Regular rate and rhythm without murmurs, rubs, or gallops appreciated.   ABDOMEN:  Bowel sounds are active.  Abdomen is slightly distended in the right lower quadrant and tender diffusely.  The patient has been noted to have femoral bruits, but pulses are palpable throughout.  RECTAL:  Revealed no abnormalities done by the P.A.  As stated laboratory studies showed a mild anemia and slight elevation of the BUN, but was otherwise normal at this time.  Chest x-ray  unremarkable and CAT scan stated done last week was unremarkable of the abdomen and pelvis.  IMPRESSION:  Certainly given what the patient tells me that one consideration might have to be depression.  She certainly has a flat affect, she admits to the fact that this all started with the death of her husband four years ago, and that may need to be addressed and further treated if it appears clinically relevant to Dr. Chilton Si.  As far as the hardening of the abdomen that she has felt, this could certainly be due to irritable bowel syndrome, but would give her the favor of making sure there is nothing organic as a cause.  She does have diffuse tenderness, although, her response to palpation is not mirrored in her facies.  She just states that it hurts all over quite a bit.  Her abdomen is soft, she has bowel sounds.  It is mildly distended in the right side as if she might have a hernia there.  Despite negative CT a week ago, we will go ahead and get a flat and upright abdominal film.  A consideration, I guess, would have to be for ischemic bowel and an MR angiogram might be the next step if nothing else turns.  We will consider endoscopy and because of the rectal bleeding, a colonoscopy might be at some time warranted as well.  I will follow with you during this hospitalization. DD:  05/21/00 TD:  05/21/00 Job: [id number]

## 2011-01-08 NOTE — Procedures (Signed)
[place]. [facility]  Patient:    Stephanie Colon, Stephanie Colon                         MRN: [id number] Adm. Date:  [id number] Attending:  Sabino Gasser CC:         Ollen Gross. Vernell Morgans, M.D.   Procedure Report  PROCEDURE:  Colonoscopy.  INDICATIONS:  Previous colon resection due to perforation from Crohns disease that had been previously undiagnosed.  Colonoscopy done to assess activity of Crohns in the remaining colon.  ANESTHESIA:  Demerol 75 mg, Versed 8 mg was given intravenously.  DESCRIPTION OF PROCEDURE:  With patient mildly sedated in the left lateral decubitus position, the Olympus videoscopic variable-stiffness colonoscope was inserted in the rectum and passed under direct vision to the neo-cecum.  This area was photographed and notable only for changes of melanosis coli, and this was photographed.  From this point, the colonoscope was slowly withdrawn, taking circumferential views of the entire colonic mucosa visualized until the colonoscope was pulled back all the way to the rectum, where three large polyps were seen, photographed, and removed, two using snare cautery technique, setting of 20-20 blended current, and the third was removed using hot biopsy forceps technique, setting of 20-20 blended current.  They were all placed in one jar for pathology report.  The endoscope was then withdrawn. Retroflex view of the rectum showed otherwise unremarkable examination.  The endoscope was withdrawn.  The patients vital signs and pulse oximetry remained stable.  The patient tolerated the procedure well without apparent complications.  FINDINGS:  Melanosis coli seen throughout the colon to the neo-cecum, and three polyps seen in the rectum, removed, await biopsy report.  The patient will call me for results and follow up with me as an outpatient.  Will also plan to have patient get small bowel series to evaluate the small bowel for any evidence of Crohns disease. DD:   12/06/00 TD:  12/06/00 Job: [id number]

## 2011-01-08 NOTE — H&P (Signed)
[place]. [facility]  Patient:    Stephanie Colon, Stephanie Colon                         MRN: [id number] Adm. Date:  [id number] Attending:  Kimber Relic                         History and Physical  CHIEF COMPLAINT:  Admitted for strengthening following resection of terminal ileum, small bowel obstruction/perforation.  HISTORY OF PRESENT ILLNESS:  Briefly, this woman was admitted to the hospital on September 30 after a one week complaint of lower abdominal pain, anorexia, and weight loss. She had been seen by her primary care doctor, Dr. Chilton Si, on the day of admission and was eventually noted to be requiring admission. A CT scan, done prior to admission, did not show any abnormalities. However, one done post-admission suggested edematous distal terminal ileal loops with small bowel obstruction and questionable perforation. She went to surgery on October 4. The surgeon was Dr. Hortense Ramal. Pathology, at the time of dictation, is still waiting for a final opinion, but this could be secondary to active Crohns disease or ischemic colitis.  PHYSICAL EXAMINATION:  HEENT:  Normal.  RESPIRATORY:  Clear.  CARDIOVASCULAR:  Heart sounds are normal. There are no murmurs.  ABDOMEN:  Her incision now has an open area in the upper quadrant, which is being packed under the direction of Dr. Carolynne Edouard. She is still tender.  LOWER EXTREMITIES:  No significant findings, except bilateral lower extremity weakness. She is currently able to walk with assistance.  CURRENT MEDICATIONS: 1. Nicotine patch 21 mg q.d. 2. Acular 0.5% one drop OS b.i.d. 3. Pepcid 20 b.i.d. 4. Ativan 0.5 b.i.d. p.r.n. 5. Tylenol extra strength two p.o. q.6h. p.r.n. 6. Alphagan 0.2% one drop OS b.i.d. 7. Imodium 2 mg p.o. q.i.d. p.r.n. diarrhea.  Hopefully, the patient will continue to improve in terms of her oral intake and her mobility. She lives at the [place] and wishes to return home independently. DD:   06/06/00 TD:  06/06/00 Job: [id number]

## 2011-01-08 NOTE — Discharge Summary (Signed)
[place]. [facility]  Patient:    Stephanie Colon, Stephanie Colon                         MRN: [id number] Adm. Date:  [id number] Disc. Date: 06/06/00 Attending:  Kimber Relic                           Discharge Summary  HISTORY OF PRESENT ILLNESS: This patient is a 77 year old woman, a patient of Dr. Gaspar Skeeters, who lives independently.  She had been seen at the [facility] office on the day of admission by Dr. Chilton Si with complaints of feeling weak, chills, and sweating.  She also reported a loss of appetite with an approximate six pound weight loss in the past week.  She felt nauseated but had no vomiting.  She was recently seen by Dr. Chilton Si on May 11, 2000 with complaints of lower abdominal soreness and was scheduled for an abdominal and pelvic CT.  Both of these were done on May 13, 2000 and revealed no significant abnormalities.  She was admitted for continued complaints of lower abdominal pain, weakness, and weight loss.  PAST MEDICAL HISTORY:  1. Hypertension.  2. Vertigo in 1985.  3. Left TMJ syndrome.  4. Right bundle branch block.  5. Left posterior fascicular block.  6. Heart palpitations.  7. Bilateral femoral artery bruits.  8. Peripheral vascular disease, left leg.  9. Hypoesthesia of the left leg. 10. Epistaxis. 11. Hiatal hernia. 12. Glaucoma. 13. Macular degeneration. 14. Abnormal chest x-ray in September 2000. 15. Osteoporosis.  PAST SURGICAL HISTORY:  1. Left salpingo-oophorectomy in 1957, with a portion of the right ovary     removed.  2. Tubes placed in ears in 1990.  3. Cataract removal in 1999 from left eye.  4. Cataract removal in 1999 from right eye.  5. Retinal and corneal surgery in 1999.  PAST PROCEDURES:  1. Ultrasound of the abdomen in 1993, which was normal.  2. Upper GI Series in November 1993, which revealed a hiatal hernia.  3. Bone density in July 2001, which revealed osteoporosis.  4. CT scan of the abdomen and  pelvis in September 2000, which revealed renal     cyst; otherwise normal.  ADMISSION MEDICATIONS:  1. Ativan 0.5 mg p.o. b.i.d.  2. Zantac 75 mg 1/2 tablet q.d.  3. Titralac 1 q.d.  4. Centrum Silver 1 q.d.  5. Alphagan ophthalmic solution 1 drop to left eye b.i.d.  6. Acular 1 drop to left eye q.i.d.  7. Rescula 1 drop to left eye b.i.d.  8. Calcium with vitamin D 1 q.d.  9. Ensure 1 can q.d.  PHYSICAL EXAMINATION:  VITAL SIGNS: Temperature 97.5 degrees, pulse 95, respirations 18, blood pressure 137/51.  GENERAL: This patient is a thin, frail, elderly white female, in no acute distress.  HEENT: Mucous membranes pink and moist.  NECK: Supple.  No thyromegaly.  No adenopathy, no bruits.  BREAST: Without discrete masses, fibrocystic in nature.  HEART: Regular rate and rhythm.  No murmurs, rubs, or gallops.  PULSES: Carotids were 3+.  LUNGS: Clear to auscultation.  ABDOMEN: Soft, nondistended.  Positive bowel sounds.  GU: Normal external female genitalia.  Bimanual examination was not performed.  RECTAL: Normal sphincter tone.  BACK: Normal.  No CVA tenderness.  EXTREMITIES: Movement of extremities normal x 4.  NEUROLOGIC: The patient was awake and alert.  LABORATORY DATA/OTHER INVESTIGATIONS: Pathology  of resected colon revealed a segment of small bowel where there were perforations lined by markedly inflamed granulation tissue, markedly inflamed granulation tissue also lining fissures, and peri-intestinal abscesses; no focal granulomatous inflammation seen, but some histiocytes noted in the wall of some of the fissures and perforations; blood vessels were hyperemic and dilated and a rare blood vessel continued thrombus material; mucosa was edematous and there was inflammation within the mucosa including cryptitis.  The differential diagnosis included Crohns disease (favored) versus ischemic etiology.  Clinical correlation was recommended.  Wound culture on  May 26, 2000 grew Klebsiella pneumonia that was sensitive to everything except ampicillin.  Clostridium difficile was negative.  Urine culture grew Proteus that was sensitive to everything except nitrofurantoin.  Cholesterol was 66, triglyceride 31.  Iron was less than 10. B-12 level was greater than 2000.  Folate level was greater than 20. Comprehensive metabolic panel showed a sodium of 131, potassium 3.6, chloride 97, CO2 27, BUN 31, creatinine 1.3.  PT and PTT were normal.  Initial CBC showed a WBC of 8.9, hemoglobin 9.3, platelet count 952,000; Differential showed 82% neutrophils.  At discharge her basic metabolic panel showed a sodium of 134, potassium 3.7, chloride 101, CO2 27, BUN 12, and creatinine 0.8.  Hemoglobin was 11.3, WBC 7.8.  RADIOLOGY: Abdominal views revealed dilated small bowel loops with air fluid levels and only minimal air within the colon.  Findings were suspicious for either an early small bowel obstruction or severe ileus.  Chest x-ray showed no active disease, with no significant changes in the previously noted nodule in the right upper lobe.  Abdominal films on May 25, 2000 showed slight improvement in partial small bowel obstruction.  On May 26, 2000 abdominal views showed fewer air fluid levels, question of free intraperitoneal versus bowel by the right lobe of the liver.  On May 26, 2000 there was question of free intraperitoneal air.  CT scan of the abdomen on May 25, 2000 revealed dilated loops of small bowel, with thick walled bowel noted in the right lower quadrant of the abdomen with distal collapse of the colon.  The appendix was not visualized well.  There was some edema associated with the mesentery of the right lower abdomen and pelvis.  CT scan of the pelvis showed thickened wall segment of what appears to be probably terminal ileum.  It appears to be a proximal to partial distal small bowel obstruction.  The colon appears to be  somewhat collapsed.  There is some diverticula within the sigmoid and left lower quadrant.   HOSPITAL COURSE: This patient was admitted with abdominal pain, nausea, and weight loss.  The etiology at first was not clear; however, eventually it became clear that this was probably a small bowel obstruction.  On May 24, 2000 she was seen in consultation by Dr. Jaclynn Guarneri of general surgery and she eventually went to surgery on May 26, 2000, with finding at surgery of a small bowel perforation.  Findings on pathology were as noted above, and were discussed with Dr. Elesa Massed.  At the time of this dictation we are waiting for a second opinion on the pathology from Dr. Luisa Hart at [facility].  Postoperatively the patient had a prolonged postoperative course, with ileus and discomfort and immobility.  She required TNA but improved on a daily basis.  She was followed by Dr. Carolynne Edouard of surgery.  Two days before her discharge a small area of the incision was opened and this is currently being  packed.  Her intake at discharge is still marginal and her albumin is less than 1.5.  FINAL DIAGNOSES:  1. Small bowel obstruction with small bowel perforation.  2. Question if #1 is secondary to Crohns disease.  Second opinion with     pathology pending.  3. Severe protein/calorie malnutrition.  Addressed initially with TPN;     however, now she has gradually improved in terms of her diet intake.  4. Diarrhea, question resolving ileus.  Clostridium difficile seems to be     negative.  5. Question urinary tract infection, with culture and sensitivities     pending at the time of this dictation.  DISCHARGE MEDICATIONS:  1. Nicotine patch 21 mg q.d.  2. Acular 0.5% 1 drop OS b.i.d.  3. Pepcid 20 mg b.i.d.  4. Ativan 0.5 mg b.i.d. p.r.n.  5. Tylenol Extra Strength 2 p.o. q.6h p.r.n. pain.  6. Alphagan 0.2% 1 drop OS b.i.d.  DISCHARGE CONDITION: The patient is discharged in stable and  gradually improving condition to the subacute care unit.  She lives alone and it was not felt that she could care for herself adequately. DD:  06/06/00 TD:  06/06/00 Job: [id number]

## 2011-02-20 ENCOUNTER — Encounter: Payer: Self-pay | Admitting: Internal Medicine

## 2011-06-04 LAB — I-STAT 8, (EC8 V) (CONVERTED LAB)
BUN: 18
Chloride: 103
HCT: 36
Potassium: 3.8
pCO2, Ven: 44.2 — ABNORMAL LOW
pH, Ven: 7.382 — ABNORMAL HIGH

## 2011-06-04 LAB — DIFFERENTIAL
Eosinophils Relative: 2
Lymphocytes Relative: 25
Lymphs Abs: 1.8
Monocytes Absolute: 0.6
Neutro Abs: 4.7

## 2011-06-04 LAB — POCT I-STAT CREATININE
Creatinine, Ser: 1.1
Operator id: 288331

## 2011-06-04 LAB — CBC
HCT: 33.5 — ABNORMAL LOW
Hemoglobin: 11.3 — ABNORMAL LOW
RBC: 3.53 — ABNORMAL LOW
WBC: 7.3

## 2011-06-07 LAB — CBC
HCT: 32.7 — ABNORMAL LOW
HCT: 35.3 — ABNORMAL LOW
HCT: 39.1
HCT: 40.1
Hemoglobin: 12.2
Hemoglobin: 11.9 — ABNORMAL LOW
Hemoglobin: 13.4
MCHC: 33.7
MCHC: 33.5
MCHC: 33.7
MCHC: 34.3
MCV: 95.5
MCV: 96.6
MCV: 96.8
MCV: 97
Platelets: 302
Platelets: 331
Platelets: 352
Platelets: 414 — ABNORMAL HIGH
RBC: 3.8 — ABNORMAL LOW
RBC: 3.38 — ABNORMAL LOW
RBC: 3.42 — ABNORMAL LOW
RBC: 3.66 — ABNORMAL LOW
RDW: 12.7
RDW: 13.1
RDW: 13.1
RDW: 13.1
WBC: 14.1 — ABNORMAL HIGH
WBC: 20.4 — ABNORMAL HIGH

## 2011-06-07 LAB — COMPREHENSIVE METABOLIC PANEL
ALT: 24
AST: 18
AST: 22
AST: 27
Albumin: 1.8 — ABNORMAL LOW
Albumin: 3.2 — ABNORMAL LOW
Alkaline Phosphatase: 111
Alkaline Phosphatase: 117
BUN: 21
BUN: 29 — ABNORMAL HIGH
CO2: 28
Calcium: 9.1
Calcium: 10.3
Chloride: 105
Chloride: 92 — ABNORMAL LOW
Creatinine, Ser: 1.2
GFR calc Af Amer: 58 — ABNORMAL LOW
GFR calc Af Amer: 52 — ABNORMAL LOW
GFR calc non Af Amer: 50 — ABNORMAL LOW
Glucose, Bld: 85
Potassium: 3.8
Potassium: 3.9
Sodium: 138
Total Bilirubin: 0.4
Total Bilirubin: 0.5
Total Protein: 5 — ABNORMAL LOW

## 2011-06-07 LAB — BASIC METABOLIC PANEL
BUN: 25 — ABNORMAL HIGH
BUN: 25 — ABNORMAL HIGH
CO2: 26
CO2: 29
CO2: 30
CO2: 32
Calcium: 9.2
Chloride: 105
Chloride: 96
Creatinine, Ser: 1.02
GFR calc Af Amer: 54 — ABNORMAL LOW
GFR calc non Af Amer: 34 — ABNORMAL LOW
GFR calc non Af Amer: 44 — ABNORMAL LOW
Glucose, Bld: 110 — ABNORMAL HIGH
Glucose, Bld: 121 — ABNORMAL HIGH
Glucose, Bld: 167 — ABNORMAL HIGH
Glucose, Bld: 218 — ABNORMAL HIGH
Potassium: 3.1 — ABNORMAL LOW
Potassium: 3.6
Potassium: 3.7
Potassium: 4.5
Sodium: 135
Sodium: 138
Sodium: 139

## 2011-06-07 LAB — POCT CARDIAC MARKERS
CKMB, poc: <1 — ABNORMAL LOW
Operator id: 277751
Troponin i, poc: <0.05

## 2011-06-07 LAB — DIFFERENTIAL
Basophils Absolute: 0
Basophils Absolute: 0
Basophils Absolute: 0
Basophils Absolute: 0
Basophils Relative: 1
Basophils Relative: 0
Basophils Relative: 0
Eosinophils Absolute: 0.3
Eosinophils Absolute: 0.2
Eosinophils Absolute: 0.3
Eosinophils Relative: 3
Eosinophils Relative: 0
Eosinophils Relative: 0
Eosinophils Relative: 1
Eosinophils Relative: 2
Eosinophils Relative: 2
Lymphocytes Relative: 2 — ABNORMAL LOW
Lymphocytes Relative: 23
Lymphocytes Relative: 7 — ABNORMAL LOW
Lymphocytes Relative: 7 — ABNORMAL LOW
Lymphs Abs: 2.2
Lymphs Abs: 1.7
Monocytes Absolute: 0.8 — ABNORMAL HIGH
Monocytes Absolute: 0.1 — ABNORMAL LOW
Monocytes Absolute: 0.4
Monocytes Absolute: 0.7
Monocytes Absolute: 1 — ABNORMAL HIGH
Monocytes Absolute: 1 — ABNORMAL HIGH
Monocytes Relative: 8
Monocytes Relative: 1 — ABNORMAL LOW
Monocytes Relative: 5
Neutro Abs: 11.8 — ABNORMAL HIGH
Neutro Abs: 4.6
Neutro Abs: 7.7
Neutrophils Relative %: 64
Neutrophils Relative %: 66
Neutrophils Relative %: 93 — ABNORMAL HIGH

## 2011-06-07 LAB — B-NATRIURETIC PEPTIDE (CONVERTED LAB): Pro B Natriuretic peptide (BNP): 129 — ABNORMAL HIGH

## 2011-06-07 LAB — URINALYSIS, ROUTINE W REFLEX MICROSCOPIC
Ketones, ur: NEGATIVE
Nitrite: NEGATIVE
Specific Gravity, Urine: 1.02
Urobilinogen, UA: 0.2

## 2011-07-07 ENCOUNTER — Ambulatory Visit (INDEPENDENT_AMBULATORY_CARE_PROVIDER_SITE_OTHER): Payer: Medicare Other | Admitting: *Deleted

## 2011-07-07 DIAGNOSIS — Z23 Encounter for immunization: Secondary | ICD-10-CM

## 2011-08-19 ENCOUNTER — Ambulatory Visit: Payer: Medicare Other | Admitting: Internal Medicine

## 2011-08-25 ENCOUNTER — Ambulatory Visit (INDEPENDENT_AMBULATORY_CARE_PROVIDER_SITE_OTHER): Payer: Medicare Other | Admitting: Internal Medicine

## 2011-08-25 ENCOUNTER — Encounter: Payer: Self-pay | Admitting: Internal Medicine

## 2011-08-25 ENCOUNTER — Other Ambulatory Visit (INDEPENDENT_AMBULATORY_CARE_PROVIDER_SITE_OTHER): Payer: Medicare Other

## 2011-08-25 ENCOUNTER — Other Ambulatory Visit: Payer: Self-pay | Admitting: Internal Medicine

## 2011-08-25 VITALS — BP 122/82 | HR 119 | Temp 96.7°F | Wt 123.8 lb

## 2011-08-25 DIAGNOSIS — R197 Diarrhea, unspecified: Secondary | ICD-10-CM

## 2011-08-25 DIAGNOSIS — K8689 Other specified diseases of pancreas: Secondary | ICD-10-CM

## 2011-08-25 DIAGNOSIS — R110 Nausea: Secondary | ICD-10-CM

## 2011-08-25 DIAGNOSIS — I10 Essential (primary) hypertension: Secondary | ICD-10-CM

## 2011-08-25 LAB — BASIC METABOLIC PANEL
CO2: 28 mEq/L (ref 19–32)
GFR: 34.63 mL/min — ABNORMAL LOW (ref >60.00)
Glucose, Bld: 118 mg/dL — ABNORMAL HIGH (ref 70–99)
Potassium: 3.6 mEq/L (ref 3.5–5.1)
Sodium: 141 mEq/L (ref 135–145)

## 2011-08-25 LAB — CBC WITH DIFFERENTIAL/PLATELET
Basophils Absolute: 0.1 10*3/uL (ref 0.0–0.1)
HCT: 40.5 % (ref 36.0–46.0)
Hemoglobin: 13.8 g/dL (ref 12.0–15.0)
Lymphs Abs: 1.6 10*3/uL (ref 0.7–4.0)
Monocytes Relative: 5.8 % (ref 3.0–12.0)
Neutro Abs: 6 10*3/uL (ref 1.4–7.7)
RDW: 12.8 % (ref 11.5–14.6)

## 2011-08-25 LAB — HEPATIC FUNCTION PANEL: Total Bilirubin: 0.6 mg/dL (ref 0.3–1.2)

## 2011-08-25 LAB — TSH: TSH: 1.17 u[IU]/mL (ref 0.35–5.50)

## 2011-08-25 MED ORDER — AMYLASE-LIPASE-PROTEASE 56-20-44 MU PO CPEP: 1.0000 | ORAL_CAPSULE | Freq: Three times a day (TID) | ORAL | Status: AC

## 2011-08-25 MED ORDER — TRIAMTERENE-HCTZ 37.5-25 MG PO TABS: 0.5000 | ORAL_TABLET | Freq: Every day | ORAL | Status: DC

## 2011-08-25 MED ORDER — TEMAZEPAM 7.5 MG PO CAPS: 7.5000 mg | ORAL_CAPSULE | Freq: Every evening | ORAL | Status: DC | PRN

## 2011-08-25 MED ORDER — PAROXETINE HCL ER 25 MG PO TB24: 25.0000 mg | ORAL_TABLET | ORAL | Status: DC

## 2011-08-25 NOTE — Progress Notes (Signed)
Subjective:    Patient ID: Stephanie Colon, female    DOB: [birth year], 78 y.o.   MRN: [id number]  HPI Here for follow up - reviewed chronic medical issues: last seen by PCP   anxiety -   improved on generic paxil but still "nervous" per family member -reports compliance with ongoing medical treatment and no changes in medication dose or frequency. denies adverse side effects related to current therapy.     hosp 10/2009 for bilary pancreatitis -   no abd pain or trouble with by mouth intake -   no pain now as before- no weight loss - remains off statin due to same Continued nausea without vomiting and diarrhea   HTN - reports compliance with ongoing medical treatment and no changes in medication dose or frequency. denies adverse side effects related to current therapy. no CP, edema, HA or vision changes   dyslipidemia- reports compliance with ongoing medical treatment and no changes in medication dose or frequency. denies adverse side effects related to current therapy. as above, off statin tx since 10/2009 hosp for pancreatitis but tol low dose simva for years without problems    Past Medical History  Diagnosis Date  . ANXIETY   . ASTHMA   . COLONIC POLYPS, HX OF   . GALLSTONE PANCREATITIS 10/2009  . GLUCOMA   . HYPERLIPIDEMIA   . HYPERTENSION     Review of Systems  Constitutional: Positive for fatigue. Negative for fever and chills.  Respiratory: Negative for cough and shortness of breath.   Cardiovascular: Negative for chest pain and palpitations.  Gastrointestinal: Positive for nausea and diarrhea. Negative for vomiting and abdominal pain.       Objective:   Physical Exam BP 122/82  Pulse 119  Temp(Src) 96.7 F (35.9 C) (Oral)  Wt 123 lb 12.8 oz (56.155 kg)  SpO2 99% Wt Readings from Last 3 Encounters:  08/25/11 123 lb 12.8 oz (56.155 kg)  05/27/10 115 lb (52.164 kg)  05/11/10 115 lb 1.9 oz (52.218 kg)   Constitutional: She appears well-developed and well-nourished.  No distress. very HOH - Niece Darel Hong at side Cardiovascular: Normal rate, regular rhythm and normal heart sounds.  No murmur heard. No BLE edema. Pulmonary/Chest: Effort normal and breath sounds normal. No respiratory distress. She has no wheezes.  Abdominal: Soft. Bowel sounds are normal. She exhibits no distension. There is no tenderness. no masses Skin: Skin is very dry over BLE -but warm and dry. No rash noted. No erythema.  Psychiatric: She has a normal mood and affect. Her behavior is normal. Judgment and thought content normal.   Lab Results  Component Value Date   WBC 7.3 05/01/2010   HGB 12.8 05/01/2010   HCT 37.1 05/01/2010   PLT 320.0 05/01/2010   GLUCOSE 98 05/01/2010   CHOL 159 05/11/2010   TRIG 292.0* 05/11/2010   HDL 49.10 05/11/2010   LDLDIRECT 69.8 05/11/2010   LDLCALC  Value: 41         11/06/2009   ALT 14 05/01/2010   AST 22 05/01/2010   NA 138 05/01/2010   K 3.7 05/01/2010   CL 103 05/01/2010   CREATININE 1.3* 05/01/2010   BUN 23 05/01/2010   CO2 25 05/01/2010   TSH 0.59 05/01/2010   INR 1.0 03/15/2007   HGBA1C  Value: 6.3 (NOTE)  11/03/2009        Assessment & Plan:  Nausea/diarrhea - suspect chronic panc insuff following severe pancreatitis 10/2009 Check labs now Start pancrease if no  other significant abn  Also See problem list. Medications and labs reviewed today.

## 2011-08-25 NOTE — Patient Instructions (Signed)
It was good to see you today. Test(s) ordered today. Your results will be called to you after review (48-72hours after test completion). If any changes need to be made, you will be notified at that time. Medications reviewed, no changes at this time. Refill on medication(s) as discussed today. Please schedule followup in 6 months for blood pressure check and review, call sooner if problems.

## 2011-08-25 NOTE — Assessment & Plan Note (Signed)
BP Readings from Last 3 Encounters:  08/25/11 122/82  05/27/10 112/64  05/11/10 130/70   The current medical regimen is effective;  continue present plan and medications.

## 2012-06-11 ENCOUNTER — Other Ambulatory Visit: Payer: Self-pay | Admitting: Internal Medicine

## 2013-03-19 ENCOUNTER — Encounter: Payer: Self-pay | Admitting: Internal Medicine

## 2013-03-19 ENCOUNTER — Ambulatory Visit (INDEPENDENT_AMBULATORY_CARE_PROVIDER_SITE_OTHER): Payer: Medicare Other | Admitting: Internal Medicine

## 2013-03-19 VITALS — BP 148/72 | HR 86 | Temp 98.3°F | Wt 128.8 lb

## 2013-03-19 DIAGNOSIS — R413 Other amnesia: Secondary | ICD-10-CM

## 2013-03-19 DIAGNOSIS — I10 Essential (primary) hypertension: Secondary | ICD-10-CM

## 2013-03-19 DIAGNOSIS — F329 Major depressive disorder, single episode, unspecified: Secondary | ICD-10-CM

## 2013-03-19 MED ORDER — VENLAFAXINE HCL ER 37.5 MG PO TB24: 37.5000 mg | ORAL_TABLET | Freq: Every day | ORAL | Status: DC

## 2013-03-19 MED ORDER — LOSARTAN POTASSIUM 25 MG PO TABS: 25.0000 mg | ORAL_TABLET | Freq: Every day | ORAL | Status: DC

## 2013-03-19 NOTE — Assessment & Plan Note (Signed)
BP Readings from Last 3 Encounters:  03/19/13 148/72  08/25/11 122/82  05/27/10 112/64  avoid diuretics because of tendency towards dehydration Begin low-dose ARB Titrate as needed, recheck in 2-6 weeks

## 2013-03-19 NOTE — Progress Notes (Signed)
Subjective:    Patient ID: Stephanie Colon, female    DOB: Jan 13, 1919, 79 y.o.   MRN: [id number]  HPI  Here for follow up - reviewed chronic medical issues: last seen by me 08/2011  Increasing difficulty with fatigue and memory problems Family reports patient has stopped all medications except for generic paxil - higher dose Paxil causes hallucinations and confusion per family  Past Medical History  Diagnosis Date  . ANXIETY   . ASTHMA   . COLONIC POLYPS, HX OF   . GALLSTONE PANCREATITIS 10/2009  . GLUCOMA   . HYPERLIPIDEMIA   . HYPERTENSION     Review of Systems  Constitutional: Positive for fatigue. Negative for fever and chills.  Respiratory: Negative for cough and shortness of breath.   Cardiovascular: Negative for chest pain and palpitations.  Psychiatric/Behavioral: Positive for confusion and dysphoric mood. Negative for suicidal ideas, sleep disturbance and self-injury. The patient is nervous/anxious. The patient is not hyperactive.        Objective:   Physical Exam  BP 148/72  Pulse 86  Temp(Src) 98.3 F (36.8 C) (Oral)  Wt 128 lb 12.8 oz (58.423 kg)  BMI 21.77 kg/m2  SpO2 96% Wt Readings from Last 3 Encounters:  03/19/13 128 lb 12.8 oz (58.423 kg)  08/25/11 123 lb 12.8 oz (56.155 kg)  05/27/10 115 lb (52.164 kg)   Constitutional: She appears well-developed and well-nourished. No distress. very HOH - Niece Darel Hong at side Cardiovascular: Normal rate, regular rhythm and normal heart sounds.  No murmur heard. No BLE edema. Pulmonary/Chest: Effort normal and breath sounds normal. No respiratory distress. She has no wheezes.  Neuro - AAOx3 - poor ST recall 2/3 at 3 min - MAE well, no CN deficts Psychiatric: She has a mildly dysphoric mood and affect. Her behavior is normal. Judgment and thought content normal.   Lab Results  Component Value Date   WBC 8.2 08/25/2011   HGB 13.8 08/25/2011   HCT 40.5 08/25/2011   PLT 361.0 08/25/2011   GLUCOSE 118* 08/25/2011   CHOL 159  05/11/2010   TRIG 292.0* 05/11/2010   HDL 49.10 05/11/2010   LDLDIRECT 69.8 05/11/2010   LDLCALC  Value: 41        Total Cholesterol/HDL:CHD Risk Coronary Heart Disease Risk Table                     Men   Women  1/2 Average Risk   3.4   3.3  Average Risk       5.0   4.4  2 X Average Risk   9.6   7.1  3 X Average Risk  23.4   11.0        Use the calculated Patient Ratio above and the CHD Risk Table to determine the patient's CHD Risk.        ATP III CLASSIFICATION (LDL):  <100     mg/dL   Optimal  409-811  mg/dL   Near or Above                    Optimal  130-159  mg/dL   Borderline  914-782  mg/dL   High  >956     mg/dL   Very High 10/06/863   ALT 22 08/25/2011   AST 29 08/25/2011   NA 141 08/25/2011   K 3.6 08/25/2011   CL 103 08/25/2011   CREATININE 1.5* 08/25/2011   BUN 26* 08/25/2011   CO2 28 08/25/2011  TSH 1.17 08/25/2011   INR 1.0 03/15/2007   HGBA1C 6.0 08/25/2011         Assessment & Plan:   See problem list. Medications and labs reviewed today.

## 2013-03-19 NOTE — Assessment & Plan Note (Signed)
Intolerant of previous citalopram trial Current Paxil not controlling symptoms, higher dose causes side effects Change to SSRI, low-dose therapy we reviewed potential risk/benefit and possible side effects - Followup in 6 weeks to review symptoms and titrate as tolerated, patient/family understand and agree to same

## 2013-03-19 NOTE — Assessment & Plan Note (Signed)
Suspect mild but progressive dementia Encouraged embolization of overlapping depression Encouraged to seek socialization and avoid remaining isolated in home Followup in 6 weeks on symptoms, family call sooner if problems

## 2013-03-19 NOTE — Patient Instructions (Signed)
It was good to see you today. We have reviewed your prior records including labs and tests today Medications reviewed and updated, Stop extended release Paxil, begin generic Effexor once daily Start low dose losartan daily for blood pressure Prescriptions printed, signed and given to you to take to local pharmacy. If problems with medications, stop medication and contact our office for further instructions Followup in 6 weeks for mood recheck and blood pressure review, call sooner if problems

## 2013-05-14 ENCOUNTER — Ambulatory Visit (INDEPENDENT_AMBULATORY_CARE_PROVIDER_SITE_OTHER): Payer: Medicare Other | Admitting: Internal Medicine

## 2013-05-14 ENCOUNTER — Other Ambulatory Visit (INDEPENDENT_AMBULATORY_CARE_PROVIDER_SITE_OTHER): Payer: Medicare Other

## 2013-05-14 ENCOUNTER — Encounter: Payer: Self-pay | Admitting: Internal Medicine

## 2013-05-14 VITALS — BP 130/72 | HR 94 | Temp 97.3°F | Wt 126.4 lb

## 2013-05-14 DIAGNOSIS — Z23 Encounter for immunization: Secondary | ICD-10-CM

## 2013-05-14 DIAGNOSIS — R5381 Other malaise: Secondary | ICD-10-CM

## 2013-05-14 DIAGNOSIS — I10 Essential (primary) hypertension: Secondary | ICD-10-CM

## 2013-05-14 DIAGNOSIS — R5383 Other fatigue: Secondary | ICD-10-CM

## 2013-05-14 DIAGNOSIS — F329 Major depressive disorder, single episode, unspecified: Secondary | ICD-10-CM

## 2013-05-14 DIAGNOSIS — E785 Hyperlipidemia, unspecified: Secondary | ICD-10-CM

## 2013-05-14 DIAGNOSIS — R413 Other amnesia: Secondary | ICD-10-CM

## 2013-05-14 LAB — BASIC METABOLIC PANEL
CO2: 25 mEq/L (ref 19–32)
Calcium: 9.8 mg/dL (ref 8.4–10.5)
Chloride: 104 mEq/L (ref 96–112)
Glucose, Bld: 109 mg/dL — ABNORMAL HIGH (ref 70–99)
Potassium: 3.9 mEq/L (ref 3.5–5.1)
Sodium: 136 mEq/L (ref 135–145)

## 2013-05-14 LAB — CBC WITH DIFFERENTIAL/PLATELET
Basophils Absolute: 0 10*3/uL (ref 0.0–0.1)
Eosinophils Absolute: 0.2 10*3/uL (ref 0.0–0.7)
HCT: 38.8 % (ref 36.0–46.0)
Hemoglobin: 13.1 g/dL (ref 12.0–15.0)
Lymphs Abs: 1.6 10*3/uL (ref 0.7–4.0)
MCHC: 33.8 g/dL (ref 30.0–36.0)
MCV: 94.7 fl (ref 78.0–100.0)
Monocytes Absolute: 0.4 10*3/uL (ref 0.1–1.0)
Neutro Abs: 4.1 10*3/uL (ref 1.4–7.7)
RDW: 12.7 % (ref 11.5–14.6)

## 2013-05-14 LAB — LDL CHOLESTEROL, DIRECT: Direct LDL: 81.9 mg/dL

## 2013-05-14 NOTE — Assessment & Plan Note (Addendum)
Intolerant of previous citalopram trial,  Paxil did not control symptoms and intolerant of higher doses Changed to Effexor in July 2014 and family reports pt with improved mood.   Still with some irritability/lack of desire  Consider increasing Effexor dose if screening metabolic labs are normal

## 2013-05-14 NOTE — Assessment & Plan Note (Signed)
Hesitant to use statin given adv age, but we'll check lipid profile for risk stratification

## 2013-05-14 NOTE — Progress Notes (Signed)
Subjective:    Patient ID: Stephanie Colon, female    DOB: 11/01/1918, 79 y.o.   MRN: [id number]  HPI  Patient here for follow-up.  Chronic medical issues reviewed:  HTN - Cozaar started at last visit in July 2014.  Tolerating this medication. Denies chest pain/shortness of breath.    Memory problems/depression - Paxil changed to Effexor at last visit. Niece states that mood is improved.  Pt still with some irritability and lack of desire to go out of the house.  Past Medical History  Diagnosis Date  . ANXIETY   . ASTHMA   . COLONIC POLYPS, HX OF   . GALLSTONE PANCREATITIS 10/2009  . GLUCOMA   . HYPERLIPIDEMIA   . HYPERTENSION     Review of Systems  Constitutional: Positive for fatigue. Negative for fever, chills and unexpected weight change.  Respiratory: Negative for chest tightness and shortness of breath.   Cardiovascular: Negative for chest pain and leg swelling.  Gastrointestinal: Negative for nausea, vomiting, diarrhea and constipation.  Psychiatric/Behavioral: Positive for dysphoric mood and agitation.       Objective:   Physical Exam  Constitutional: She is oriented to person, place, and time. She appears well-developed and well-nourished. No distress.  HENT:  Head: Normocephalic and atraumatic.  Neck: Normal range of motion. Neck supple. No thyromegaly present.  Cardiovascular: Normal rate and regular rhythm.   No murmur heard. Pulmonary/Chest: Effort normal and breath sounds normal. No respiratory distress. She has no wheezes. She has no rales.  Neurological: She is alert and oriented to person, place, and time.  Skin: Skin is warm and dry.  Psychiatric: Her behavior is normal.  Mildly dysphoric    Wt Readings from Last 3 Encounters:  05/14/13 126 lb 6.4 oz (57.335 kg)  03/19/13 128 lb 12.8 oz (58.423 kg)  08/25/11 123 lb 12.8 oz (56.155 kg)   BP Readings from Last 3 Encounters:  05/14/13 130/72  03/19/13 148/72  08/25/11 122/82   Lab Results   Component Value Date   WBC 8.2 08/25/2011   HGB 13.8 08/25/2011   HCT 40.5 08/25/2011   PLT 361.0 08/25/2011   GLUCOSE 118* 08/25/2011   CHOL 159 05/11/2010   TRIG 292.0* 05/11/2010   HDL 49.10 05/11/2010   LDLDIRECT 69.8 05/11/2010   LDLCALC  Value: 41        Total Cholesterol/HDL:CHD Risk Coronary Heart Disease Risk Table                     Men   Women  1/2 Average Risk   3.4   3.3  Average Risk       5.0   4.4  2 X Average Risk   9.6   7.1  3 X Average Risk  23.4   11.0        Use the calculated Patient Ratio above and the CHD Risk Table to determine the patient's CHD Risk.        ATP III CLASSIFICATION (LDL):  <100     mg/dL   Optimal  409-811  mg/dL   Near or Above                    Optimal  130-159  mg/dL   Borderline  914-782  mg/dL   High  >956     mg/dL   Very High 10/06/863   ALT 22 08/25/2011   AST 29 08/25/2011   NA 141 08/25/2011   K 3.6 08/25/2011  CL 103 08/25/2011   CREATININE 1.5* 08/25/2011   BUN 26* 08/25/2011   CO2 28 08/25/2011   TSH 1.17 08/25/2011   INR 1.0 03/15/2007   HGBA1C 6.0 08/25/2011        Assessment & Plan:   Fatigue -  Complains of exercise intolerance.  Denies chest pain or shortness of breath.  nonspecific symptoms/exam - check screening labs. Consider increase in medication for depression management if labs unremarkable  Also see assessment & plan re: chronic issues

## 2013-05-14 NOTE — Patient Instructions (Signed)
It was good to see you today. Your annual flu shot was given and/or updated today. Medications reviewed and updated, no changes recommended at this time. Ok to take Tylenol 1 or 2 every AM Test(s) ordered today. Your results will be released to MyChart (or called to you) after review, usually within 72hours after test completion. If any changes need to be made, you will be notified at that same time. Stay active and be well - call if problems Please schedule followup in 6 months for routine visit, call sooner if problems.

## 2013-05-14 NOTE — Assessment & Plan Note (Addendum)
BP Readings from Last 3 Encounters:  05/14/13 130/72  03/19/13 148/72  08/25/11 122/82  avoid diuretics because of tendency towards dehydration Cozaar started July 2014 - improved Current regimen effective.  Continue same.  Tolerating medications without adverse reactions

## 2013-05-14 NOTE — Assessment & Plan Note (Addendum)
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

## 2013-08-18 ENCOUNTER — Other Ambulatory Visit: Payer: Self-pay | Admitting: Internal Medicine

## 2013-09-13 ENCOUNTER — Other Ambulatory Visit: Payer: Self-pay | Admitting: Internal Medicine

## 2013-09-17 ENCOUNTER — Other Ambulatory Visit: Payer: Self-pay | Admitting: Internal Medicine

## 2013-11-14 ENCOUNTER — Ambulatory Visit: Payer: Medicare Other | Admitting: Internal Medicine

## 2013-11-22 ENCOUNTER — Ambulatory Visit (INDEPENDENT_AMBULATORY_CARE_PROVIDER_SITE_OTHER): Payer: Medicare Other | Admitting: Internal Medicine

## 2013-11-22 ENCOUNTER — Encounter: Payer: Self-pay | Admitting: Internal Medicine

## 2013-11-22 VITALS — BP 144/78 | HR 86 | Temp 97.8°F | Wt 130.4 lb

## 2013-11-22 DIAGNOSIS — F329 Major depressive disorder, single episode, unspecified: Secondary | ICD-10-CM

## 2013-11-22 DIAGNOSIS — F32A Depression, unspecified: Secondary | ICD-10-CM

## 2013-11-22 DIAGNOSIS — F3289 Other specified depressive episodes: Secondary | ICD-10-CM

## 2013-11-22 DIAGNOSIS — I10 Essential (primary) hypertension: Secondary | ICD-10-CM

## 2013-11-22 MED ORDER — VENLAFAXINE HCL ER 75 MG PO CP24: 75.0000 mg | ORAL_CAPSULE | Freq: Every day | ORAL | Status: DC

## 2013-11-22 NOTE — Patient Instructions (Signed)
It was good to see you today.  Medications reviewed and updated  Increase Effexor to 75mg  daily  No other changes recommended today  Your prescription(s) have been submitted to your pharmacy. Please take as directed and contact our office if you believe you are having problem(s) with the medication(s).  Stay active and be well - call if problems  Please schedule followup in 6 months for routine visit, call sooner if problems.

## 2013-11-22 NOTE — Progress Notes (Signed)
Subjective:    Patient ID: Stephanie Colon, female    DOB: [birth year], 80 y.o.   MRN: [id number]  HPI  Patient is here for follow up  Reviewed chronic medical issues and interval medical events  Past Medical History  Diagnosis Date  . ANXIETY   . ASTHMA   . COLONIC POLYPS, HX OF   . GALLSTONE PANCREATITIS 10/2009  . GLUCOMA   . HYPERLIPIDEMIA   . HYPERTENSION     Review of Systems  Constitutional: Positive for fatigue. Negative for fever and unexpected weight change.  Respiratory: Negative for cough and shortness of breath.   Cardiovascular: Negative for chest pain and leg swelling.  Skin: Positive for wound (?on R shin x 2 weeks since hitting leg, tender). Negative for color change and rash.  Psychiatric/Behavioral: Positive for dysphoric mood. Negative for behavioral problems, confusion, sleep disturbance and decreased concentration. The patient is not nervous/anxious.        Objective:   Physical Exam  BP 144/78  Pulse 86  Temp(Src) 97.8 F (36.6 C) (Oral)  Wt 130 lb 6.4 oz (59.149 kg)  SpO2 95% Wt Readings from Last 3 Encounters:  11/22/13 130 lb 6.4 oz (59.149 kg)  05/14/13 126 lb 6.4 oz (57.335 kg)  03/19/13 128 lb 12.8 oz (58.423 kg)    Constitutional: She appears well-developed and well-nourished. No distress. niece Stephanie Colon at side Neck: Normal range of motion. Neck supple. No JVD present. No thyromegaly present.  Cardiovascular: Normal rate, regular rhythm and normal heart sounds.  No murmur heard. No BLE edema. Pulmonary/Chest: Effort normal and breath sounds normal. No respiratory distress. She has no wheezes. Skin: R anterior shin with tiny superficial pustule at site of prior shin bruise - expressed with gentle direct pressure - no ulceration or cellulitis  Psychiatric: She has a normal mood and affect. Her behavior is normal. Judgment and thought content normal.   Lab Results  Component Value Date   WBC 6.4 05/14/2013   HGB 13.1 05/14/2013   HCT 38.8  05/14/2013   PLT 321.0 05/14/2013   GLUCOSE 109* 05/14/2013   CHOL 170 05/14/2013   TRIG 236.0* 05/14/2013   HDL 50.20 05/14/2013   LDLDIRECT 81.9 05/14/2013   LDLCALC  Value: 41        Total Cholesterol/HDL:CHD Risk Coronary Heart Disease Risk Table                     Men   Women  1/2 Average Risk   3.4   3.3  Average Risk       5.0   4.4  2 X Average Risk   9.6   7.1  3 X Average Risk  23.4   11.0        Use the calculated Patient Ratio above and the CHD Risk Table to determine the patient's CHD Risk.        ATP III CLASSIFICATION (LDL):  <100     mg/dL   Optimal  409-811100-129  mg/dL   Near or Above                    Optimal  130-159  mg/dL   Borderline  914-782160-189  mg/dL   High  >956>190     mg/dL   Very High 2/13/08653/17/2011   ALT 22 08/25/2011   AST 29 08/25/2011   NA 136 05/14/2013   K 3.9 05/14/2013   CL 104 05/14/2013   CREATININE 1.3* 05/14/2013  BUN 21 05/14/2013   CO2 25 05/14/2013   TSH 1.12 05/14/2013   INR 1.0 03/15/2007   HGBA1C 6.0 08/25/2011    No results found.     Assessment & Plan:   Problem List Items Addressed This Visit   Depressed - Primary     Intolerant of previous citalopram and paxil trials Changed to Effexor in July 2014 and family reports pt with improved mood.   Still with some irritability/lack of desire (per niece) will increase Effexor dose now as prior screening metabolic labs are normal    Relevant Medications      venlafaxine (EFFEXOR-XR) 24 hr capsule   HYPERTENSION      BP Readings from Last 3 Encounters:  11/22/13 144/78  05/14/13 130/72  03/19/13 148/72  avoid diuretics because of tendency towards dehydration Cozaar started July 2014 - improved Current regimen effective.  Continue same.  Tolerating medications without adverse reactions

## 2013-11-22 NOTE — Progress Notes (Signed)
Pre visit review using our clinic review tool, if applicable. No additional management support is needed unless otherwise documented below in the visit note. 

## 2013-11-22 NOTE — Assessment & Plan Note (Signed)
Intolerant of previous citalopram and paxil trials Changed to Effexor in July 2014 and family reports pt with improved mood.   Still with some irritability/lack of desire (per niece) will increase Effexor dose now as prior screening metabolic labs are normal

## 2013-11-22 NOTE — Assessment & Plan Note (Signed)
BP Readings from Last 3 Encounters:  11/22/13 144/78  05/14/13 130/72  03/19/13 148/72  avoid diuretics because of tendency towards dehydration Cozaar started July 2014 - improved Current regimen effective.  Continue same.  Tolerating medications without adverse reactions

## 2013-11-26 ENCOUNTER — Telehealth: Payer: Self-pay | Admitting: Internal Medicine

## 2013-11-26 NOTE — Telephone Encounter (Signed)
Relevant patient education mailed to patient.  

## 2014-04-19 ENCOUNTER — Other Ambulatory Visit: Payer: Self-pay | Admitting: Internal Medicine

## 2014-05-22 ENCOUNTER — Other Ambulatory Visit: Payer: Self-pay | Admitting: Internal Medicine

## 2014-05-22 ENCOUNTER — Ambulatory Visit: Payer: Medicare Other | Admitting: Internal Medicine

## 2014-05-28 ENCOUNTER — Ambulatory Visit (INDEPENDENT_AMBULATORY_CARE_PROVIDER_SITE_OTHER): Payer: Medicare Other | Admitting: Internal Medicine

## 2014-05-28 ENCOUNTER — Other Ambulatory Visit (INDEPENDENT_AMBULATORY_CARE_PROVIDER_SITE_OTHER): Payer: Medicare Other

## 2014-05-28 ENCOUNTER — Encounter: Payer: Self-pay | Admitting: Internal Medicine

## 2014-05-28 VITALS — BP 150/70 | HR 83 | Temp 97.7°F | Ht 64.5 in | Wt 132.5 lb

## 2014-05-28 DIAGNOSIS — R5383 Other fatigue: Secondary | ICD-10-CM

## 2014-05-28 DIAGNOSIS — Z0000 Encounter for general adult medical examination without abnormal findings: Secondary | ICD-10-CM

## 2014-05-28 DIAGNOSIS — I10 Essential (primary) hypertension: Secondary | ICD-10-CM

## 2014-05-28 DIAGNOSIS — Z23 Encounter for immunization: Secondary | ICD-10-CM

## 2014-05-28 DIAGNOSIS — F329 Major depressive disorder, single episode, unspecified: Secondary | ICD-10-CM

## 2014-05-28 DIAGNOSIS — F32A Depression, unspecified: Secondary | ICD-10-CM

## 2014-05-28 DIAGNOSIS — H6122 Impacted cerumen, left ear: Secondary | ICD-10-CM

## 2014-05-28 DIAGNOSIS — E785 Hyperlipidemia, unspecified: Secondary | ICD-10-CM

## 2014-05-28 LAB — CBC WITH DIFFERENTIAL/PLATELET
BASOS ABS: 0 10*3/uL (ref 0.0–0.1)
Basophils Relative: 0.5 % (ref 0.0–3.0)
Eosinophils Absolute: 0.1 10*3/uL (ref 0.0–0.7)
Eosinophils Relative: 0.9 % (ref 0.0–5.0)
HCT: 38.6 % (ref 36.0–46.0)
Hemoglobin: 12.9 g/dL (ref 12.0–15.0)
LYMPHS PCT: 21.7 % (ref 12.0–46.0)
Lymphs Abs: 1.6 10*3/uL (ref 0.7–4.0)
MCHC: 33.5 g/dL (ref 30.0–36.0)
MCV: 96.1 fl (ref 78.0–100.0)
MONOS PCT: 5.7 % (ref 3.0–12.0)
Monocytes Absolute: 0.4 10*3/uL (ref 0.1–1.0)
Neutro Abs: 5.3 10*3/uL (ref 1.4–7.7)
Neutrophils Relative %: 71.2 % (ref 43.0–77.0)
Platelets: 354 10*3/uL (ref 150.0–400.0)
RBC: 4.01 Mil/uL (ref 3.87–5.11)
RDW: 13.3 % (ref 11.5–15.5)
WBC: 7.5 10*3/uL (ref 4.0–10.5)

## 2014-05-28 NOTE — Patient Instructions (Signed)
It was good to see you today.  Your annual flu shot was given and/or updated today.  Your ears have been irrigated of wax today -let us know if continued hearing problems persist for referral to audiologist and hearing testing  We have reviewed your prior records including labs and tests today  Health Maintenance reviewed - all recommended immunizations and age-appropriate screenings are up-to-date.  Test(s) ordered today. Your results will be released to MyChart (or called to you) after review, usually within 72hours after test completion. If any changes need to be made, you will be notified at that same time.  Medications reviewed and updated, no changes recommended at this time. If labs normal, will increase venlafaxine to 150 mg daily  Please schedule followup in 6 months for semiannual exam, call sooner if problems.

## 2014-05-28 NOTE — Assessment & Plan Note (Signed)
BP Readings from Last 3 Encounters:  05/28/14 150/70  11/22/13 144/78  05/14/13 130/72  avoid diuretics because of tendency towards dehydration Cozaar started July 2014 - improved Current regimen effective.  Continue same.  Tolerating medications without adverse reactions

## 2014-05-28 NOTE — Progress Notes (Signed)
Subjective:    Patient ID: Stephanie Colon, female    DOB: [birth year], 80 y.o.   MRN: [id number]  HPI   Here for medicare wellness  Diet: heart healthy  Physical activity: sedentary Depression/mood screen: negative Hearing: intact to whispered voice Visual acuity: grossly normal, performs annual eye exam  ADLs: capable Fall risk: none Home safety: good Cognitive evaluation: intact to orientation, naming, recall and repetition EOL planning: adv directives reviewed  I have personally reviewed and have noted 1. The patient's medical and social history 2. Their use of alcohol, tobacco or illicit drugs 3. Their current medications and supplements 4. The patient's functional ability including ADL's, fall risks, home safety risks and hearing or visual impairment. 5. Diet and physical activities 6. Evidence for depression or mood disorders  Also reviewed chronic medical issues and interval medical events  Past Medical History  Diagnosis Date  . ANXIETY   . ASTHMA   . COLONIC POLYPS, HX OF   . GALLSTONE PANCREATITIS 10/2009  . GLUCOMA   . HYPERLIPIDEMIA   . HYPERTENSION    Family History  Problem Relation Age of Onset  . Hypertension Other     other relative  . Stroke Other     other relative  . Diabetes Other     other relative   History  Substance Use Topics  . Smoking status: Former Games developer  . Smokeless tobacco: Not on file     Comment: Widowed, lives alone in her own home-drives and indep ADLs. Supportive neice Chales Abrahams who assist pt as needed  . Alcohol Use: No    Review of Systems  Constitutional: Positive for appetite change and fatigue. Negative for unexpected weight change.  HENT: Positive for hearing loss (L>R).   Respiratory: Negative for cough, shortness of breath and wheezing.   Cardiovascular: Negative for chest pain, palpitations and leg swelling.  Gastrointestinal: Negative for nausea, abdominal pain and diarrhea.  Neurological: Negative for  dizziness, weakness, light-headedness and headaches.  Psychiatric/Behavioral: Negative for dysphoric mood. The patient is not nervous/anxious.   All other systems reviewed and are negative.      Objective:   Physical Exam  BP 150/70  Pulse 83  Temp(Src) 97.7 F (36.5 C) (Oral)  Ht 5' 4.5" (1.638 m)  Wt 132 lb 8 oz (60.102 kg)  BMI 22.40 kg/m2  SpO2 97% Wt Readings from Last 3 Encounters:  05/28/14 132 lb 8 oz (60.102 kg)  11/22/13 130 lb 6.4 oz (59.149 kg)  05/14/13 126 lb 6.4 oz (57.335 kg)   Constitutional: She appears well-developed and well-nourished. No distress. dtr at side HENT: Bilateral cerumen impaction initially; after irrigation, tympanic membranes clear without effusion or erythema Neck: Normal range of motion. Neck supple. No JVD present. No thyromegaly present.  Cardiovascular: Normal rate, regular rhythm and normal heart sounds.  No murmur heard. No BLE edema. Pulmonary/Chest: Effort normal and breath sounds normal. No respiratory distress. She has no wheezes.  Psychiatric: She has a normal mood and affect. Her behavior is normal. Judgment and thought content normal.   Lab Results  Component Value Date   WBC 6.4 05/14/2013   HGB 13.1 05/14/2013   HCT 38.8 05/14/2013   PLT 321.0 05/14/2013   GLUCOSE 109* 05/14/2013   CHOL 170 05/14/2013   TRIG 236.0* 05/14/2013   HDL 50.20 05/14/2013   LDLDIRECT 81.9 05/14/2013   LDLCALC  Value: 41        Total Cholesterol/HDL:CHD Risk Coronary Heart Disease Risk Table  Men   Women  1/2 Average Risk   3.4   3.3  Average Risk       5.0   4.4  2 X Average Risk   9.6   7.1  3 X Average Risk  23.4   11.0        Use the calculated Patient Ratio above and the CHD Risk Table to determine the patient's CHD Risk.        ATP III CLASSIFICATION (LDL):  <100     mg/dL   Optimal  960-454100-129  mg/dL   Near or Above                    Optimal  130-159  mg/dL   Borderline  098-119160-189  mg/dL   High  >147>190     mg/dL   Very High 8/29/56213/17/2011   ALT  22 08/25/2011   AST 29 08/25/2011   NA 136 05/14/2013   K 3.9 05/14/2013   CL 104 05/14/2013   CREATININE 1.3* 05/14/2013   BUN 21 05/14/2013   CO2 25 05/14/2013   TSH 1.12 05/14/2013   INR 1.0 03/15/2007   HGBA1C 6.0 08/25/2011   Procedure: wax removal, bilateral Reason: wax impaction Risks and benefits of procedure discussed with the patient who agrees to proceed. Ear(s) irrigated with warm water. Large amount of wax removed. Instrumentation with metal ear loop was performed to accomplish wax removal. the patient tolerated procedure well.  No results found.     Assessment & Plan:   AWV/CPX - Today patient counseled on age appropriate routine health concerns for screening and prevention, each reviewed and up to date or declined. Immunizations reviewed and up to date or declined. Labs ordered and reviewed. Risk factors for depression reviewed and negative. Hearing function and visual acuity are intact. ADLs screened and addressed as needed. Functional ability and level of safety reviewed and appropriate. Education, counseling and referrals performed based on assessed risks today. Patient provided with a copy of personalized plan for preventive services.  Fatigue - nonspecific symptoms/exam - check screening labs  cerumen impaction left>right  -irrigation today as above, much improved! Niece to monitor and call if continued problems for referral to audiology as needed  Problem List Items Addressed This Visit   Depressed     Intolerant of previous citalopram and paxil trials Changed to Effexor in July 2014 and family reports pt with improved mood.   Still with some irritability/lack of desire (per niece) increased Effexor 11/2013 - titrate again dose now if screening metabolic labs are normal    Essential hypertension      BP Readings from Last 3 Encounters:  05/28/14 150/70  11/22/13 144/78  05/14/13 130/72  avoid diuretics because of tendency towards dehydration Cozaar started July 2014 -  improved Current regimen effective.  Continue same.  Tolerating medications without adverse reactions    Relevant Orders      Lipid panel    Other Visit Diagnoses   Routine general medical examination at a health care facility    -  Primary    Need for prophylactic vaccination and inoculation against influenza        Relevant Orders       Flu vaccine HIGH DOSE PF (Fluzone Tri High dose) (Completed)    Other fatigue        Relevant Orders       Basic metabolic panel       CBC with Differential  Hepatic function panel       TSH    Cerumen impaction, left

## 2014-05-28 NOTE — Assessment & Plan Note (Signed)
Intolerant of previous citalopram and paxil trials Changed to Effexor in July 2014 and family reports pt with improved mood.   Still with some irritability/lack of desire (per niece) increased Effexor 11/2013 - titrate again dose now if screening metabolic labs are normal

## 2014-05-28 NOTE — Progress Notes (Signed)
Pre visit review using our clinic review tool, if applicable. No additional management support is needed unless otherwise documented below in the visit note. 

## 2014-05-29 LAB — LIPID PANEL
Cholesterol: 180 mg/dL (ref 0–200)
HDL: 51.1 mg/dL (ref >39.00)
NONHDL: 128.9
Total CHOL/HDL Ratio: 4
Triglycerides: 203 mg/dL — ABNORMAL HIGH (ref 0.0–149.0)
VLDL: 40.6 mg/dL — AB (ref 0.0–40.0)

## 2014-05-29 LAB — HEPATIC FUNCTION PANEL
ALK PHOS: 69 U/L (ref 39–117)
ALT: 21 U/L (ref 0–35)
AST: 37 U/L (ref 0–37)
Albumin: 4.5 g/dL (ref 3.5–5.2)
BILIRUBIN TOTAL: 0.5 mg/dL (ref 0.2–1.2)
Bilirubin, Direct: 0.1 mg/dL (ref 0.0–0.3)
Total Protein: 8.6 g/dL — ABNORMAL HIGH (ref 6.0–8.3)

## 2014-05-29 LAB — BASIC METABOLIC PANEL
BUN: 34 mg/dL — ABNORMAL HIGH (ref 6–23)
CALCIUM: 10.9 mg/dL — AB (ref 8.4–10.5)
CO2: 21 mEq/L (ref 19–32)
Chloride: 104 mEq/L (ref 96–112)
Creatinine, Ser: 1.8 mg/dL — ABNORMAL HIGH (ref 0.4–1.2)
GFR: 28.25 mL/min — AB (ref >60.00)
GLUCOSE: 110 mg/dL — AB (ref 70–99)
Potassium: 4.5 mEq/L (ref 3.5–5.1)
SODIUM: 137 meq/L (ref 135–145)

## 2014-05-29 LAB — TSH: TSH: 1.9 u[IU]/mL (ref 0.35–4.50)

## 2014-05-29 LAB — LDL CHOLESTEROL, DIRECT: Direct LDL: 96.5 mg/dL

## 2014-05-31 ENCOUNTER — Telehealth: Payer: Self-pay

## 2014-05-31 MED ORDER — VENLAFAXINE HCL 75 MG PO TABS: 75.0000 mg | ORAL_TABLET | Freq: Two times a day (BID) | ORAL | Status: DC

## 2014-05-31 NOTE — Telephone Encounter (Signed)
Sent rx to [facility] per MD request  Informed Niece of lab results and change in strength of Venlafaxine (150mg ).

## 2014-11-11 ENCOUNTER — Ambulatory Visit (INDEPENDENT_AMBULATORY_CARE_PROVIDER_SITE_OTHER): Payer: Medicare Other | Admitting: Podiatry

## 2014-11-11 ENCOUNTER — Encounter: Payer: Self-pay | Admitting: Podiatry

## 2014-11-11 VITALS — BP 119/64 | HR 74 | Resp 12

## 2014-11-11 DIAGNOSIS — L84 Corns and callosities: Secondary | ICD-10-CM

## 2014-11-11 DIAGNOSIS — M2041 Other hammer toe(s) (acquired), right foot: Secondary | ICD-10-CM

## 2014-11-11 DIAGNOSIS — M79676 Pain in unspecified toe(s): Secondary | ICD-10-CM

## 2014-11-11 DIAGNOSIS — M2042 Other hammer toe(s) (acquired), left foot: Secondary | ICD-10-CM

## 2014-11-11 DIAGNOSIS — B351 Tinea unguium: Secondary | ICD-10-CM

## 2014-11-11 NOTE — Progress Notes (Signed)
   Subjective:    Patient ID: Stephanie SergeEdna E Colon, female    DOB: [birth year], 81 y.o.   MRN: [id number]  HPI  N-THICK, DISCOLORATION L-B/L TOENAILS  D-3 WEEKS O-SLOWLY C-SAME A-PRESSURE T-TRIM  CHECK B/L 2ND TOE  Patient denies any history of claudication or foot ulceration  Review of Systems  HENT: Positive for hearing loss.   Eyes: Positive for visual disturbance.  Cardiovascular: Positive for leg swelling.  Musculoskeletal: Positive for back pain and joint swelling.  Skin: Positive for color change.  Hematological: Bruises/bleeds easily.  All other systems reviewed and are negative.      Objective:   Physical Exam   Hard of hearing orientated 3 patient presents today with family member  Vascular: DP pulses 0/4 bilaterally PT pulses 1/4 bilaterally Capillary reflex reactive bilaterally  Neurological: Ankle reflexes equal and reactive bilaterally Vibratory sensation nonreactive bilaterally Sensation to 10 g monofilament wire intact 4/5 bilaterally  Dermatological:  Atrophic skin without any hair growth bilaterally Callused nail grooves fifth toes bilaterally Fifth toenails are hypertrophic bilaterally  Musculoskeletal: Varus rotation fif fifth toes bilaterally associated with listers corns  Hammertoe deformity second bilaterally Patient has slow stable gait There is no restriction ankle, subtalar, midtarsal joints bilaterally       Assessment & Plan:  Assessment: Decrease pedal pulses bilaterally possibly suggestive peripheral arterial disease Peripheral neuropathy bilaterally Listers corns fifth toes bilaterally Mycotic fifth toenails bilaterally Hammertoe second bilaterally  Plan: Debrided listers corns and fifth toenails without a bleeding. The fifth right nail plate is partially detached with the underlying nailbed demonstrating no inflammatory changes or clinical sign of infection  Apply antibiotic ointment to the lateral margin the fifth toenails  and applied Band-Aids  Advised patient to apply Vaseline and Band-Aids to the fifth toenails daily until discomfort ends  Reappoint at patient's request

## 2014-11-11 NOTE — Patient Instructions (Signed)
Apply Vaseline to the outer edges of the fifth toenails and cover with Band-Aids daily until pain ends Turn as needed for debridement of nails and corns

## 2014-11-28 ENCOUNTER — Ambulatory Visit (INDEPENDENT_AMBULATORY_CARE_PROVIDER_SITE_OTHER): Payer: Medicare Other | Admitting: Internal Medicine

## 2014-11-28 ENCOUNTER — Encounter: Payer: Self-pay | Admitting: Internal Medicine

## 2014-11-28 ENCOUNTER — Ambulatory Visit: Payer: Medicare Other | Admitting: Internal Medicine

## 2014-11-28 VITALS — BP 146/82 | HR 88 | Temp 97.7°F | Resp 14 | Wt 125.0 lb

## 2014-11-28 DIAGNOSIS — R413 Other amnesia: Secondary | ICD-10-CM | POA: Diagnosis not present

## 2014-11-28 DIAGNOSIS — E785 Hyperlipidemia, unspecified: Secondary | ICD-10-CM | POA: Diagnosis not present

## 2014-11-28 NOTE — Patient Instructions (Signed)
We have sent in your refills today to the pharmacy. We are not changing the medicines today.   Something to consider for the future would be hospice or palliative care services. They work with families struggling with incurable diseases such as dementia and help to support your family in its goals. It may not be appropriate right now but if things are changing for the worse you can either call us or come back sooner.   We would like to see you back in about 6 months. A good book that talks about a lot of the struggles of caring for someone with memory problems is called the 36 hour day by Denver FasterNancy Mace and is a good book to read.   Dementia Dementia is a general term for problems with brain function. A person with dementia has memory loss and a hard time with at least one other brain function such as thinking, speaking, or problem solving. Dementia can affect social functioning, how you do your job, your mood, or your personality. The changes may be hidden for a long time. The earliest forms of this disease are usually not detected by family or friends. Dementia can be:  Irreversible.  Potentially reversible.  Partially reversible.  Progressive. This means it can get worse over time. CAUSES  Irreversible dementia causes may include:  Degeneration of brain cells (Alzheimer disease or Lewy body dementia).  Multiple small strokes (vascular dementia).  Infection (chronic meningitis or Creutzfeldt-Jakob disease).  Frontotemporal dementia. This affects younger people, age 79 to 970, compared to those who have Alzheimer disease.  Dementia associated with other disorders like Parkinson disease, Huntington disease, or HIV-associated dementia. Potentially or partially reversible dementia causes may include:  Medicines.  Metabolic causes such as excessive alcohol intake, vitamin B12 deficiency, or thyroid disease.  Masses or pressure in the brain such as a tumor, blood clot, or  hydrocephalus. SIGNS AND SYMPTOMS  Symptoms are often hard to detect. Family members or coworkers may not notice them early in the disease process. Different people with dementia may have different symptoms. Symptoms can include:  A hard time with memory, especially recent memory. Long-term memory may not be impaired.  Asking the same question multiple times or forgetting something someone just said.  A hard time speaking your thoughts or finding certain words.  A hard time solving problems or performing familiar tasks (such as how to use a telephone).  Sudden changes in mood.  Changes in personality, especially increasing moodiness or mistrust.  Depression.  A hard time understanding complex ideas that were never a problem in the past. DIAGNOSIS  There are no specific tests for dementia.   Your health care provider may recommend a thorough evaluation. This is because some forms of dementia can be reversible. The evaluation will likely include a physical exam and getting a detailed history from you and a family member. The history often gives the best clues and suggestions for a diagnosis.  Memory testing may be done. A detailed brain function evaluation called neuropsychologic testing may be helpful.  Lab tests and brain imaging (such as a CT scan or MRI scan) are sometimes important.  Sometimes observation and re-evaluation over time is very helpful. TREATMENT  Treatment depends on the cause.   If the problem is a vitamin deficiency, it may be helped or cured with supplements.  For dementias such as Alzheimer disease, medicines are available to stabilize or slow the course of the disease. There are no cures for this type of  dementia.  Your health care provider can help direct you to groups, organizations, and other health care providers to help with decisions in the care of you or your loved one. HOME CARE INSTRUCTIONS The care of individuals with dementia is varied and  dependent upon the progression of the dementia. The following suggestions are intended for the person living with, or caring for, the person with dementia.  Create a safe environment.  Remove the locks on bathroom doors to prevent the person from accidentally locking himself or herself in.  Use childproof latches on kitchen cabinets and any place where cleaning supplies, chemicals, or alcohol are kept.  Use childproof covers in unused electrical outlets.  Install childproof devices to keep doors and windows secured.  Remove stove knobs or install safety knobs and an automatic shut-off on the stove.  Lower the temperature on water heaters.  Label medicines and keep them locked up.  Secure knives, lighters, matches, power tools, and guns, and keep these items out of reach.  Keep the house free from clutter. Remove rugs or anything that might contribute to a fall.  Remove objects that might break and hurt the person.  Make sure lighting is good, both inside and outside.  Install grab rails as needed.  Use a monitoring device to alert you to falls or other needs for help.  Reduce confusion.  Keep familiar objects and people around.  Use night lights or dim lights at night.  Label items or areas.  Use reminders, notes, or directions for daily activities or tasks.  Keep a simple, consistent routine for waking, meals, bathing, dressing, and bedtime.  Create a calm, quiet environment.  Place large clocks and calendars prominently.  Display emergency numbers and home address near all telephones.  Use cues to establish different times of the day. An example is to open curtains to let the natural light in during the day.   Use effective communication.  Choose simple words and short sentences.  Use a gentle, calm tone of voice.  Be careful not to interrupt.  If the person is struggling to find a word or communicate a thought, try to provide the word or thought.  Ask  one question at a time. Allow the person ample time to answer questions. Repeat the question again if the person does not respond.  Reduce nighttime restlessness.  Provide a comfortable bed.  Have a consistent nighttime routine.  Ensure a regular walking or physical activity schedule. Involve the person in daily activities as much as possible.  Limit napping during the day.  Limit caffeine.  Attend social events that stimulate rather than overwhelm the senses.  Encourage good nutrition and hydration.  Reduce distractions during meal times and snacks.  Avoid foods that are too hot or too cold.  Monitor chewing and swallowing ability.  Continue with routine vision, hearing, dental, and medical screenings.  Give medicines only as directed by the health care provider.  Monitor driving abilities. Do not allow the person to drive when safe driving is no longer possible.  Register with an identification program which could provide location assistance in the event of a missing person situation. SEEK MEDICAL CARE IF:   New behavioral problems start such as moodiness, aggressiveness, or seeing things that are not there (hallucinations).  Any new problem with brain function happens. This includes problems with balance, speech, or falling a lot.  Problems with swallowing develop.  Any symptoms of other illness happen. Small changes or worsening in any  aspect of brain function can be a sign that the illness is getting worse. It can also be a sign of another medical illness such as infection. Seeing a health care provider right away is important. SEEK IMMEDIATE MEDICAL CARE IF:   A fever develops.  New or worsened confusion develops.  New or worsened sleepiness develops.  Staying awake becomes hard to do. Document Released: 02/02/2001 Document Revised: 12/24/2013 Document Reviewed: 01/04/2011 [facility] Patient Information 2015 [place], [place]. This information is not intended to  replace advice given to you by your health care provider. Make sure you discuss any questions you have with your health care provider.

## 2014-11-28 NOTE — Progress Notes (Signed)
Pre visit review using our clinic review tool, if applicable. No additional management support is needed unless otherwise documented below in the visit note. 

## 2014-11-29 NOTE — Assessment & Plan Note (Signed)
Would not monitor and would not intervene at this time.

## 2014-11-29 NOTE — Progress Notes (Signed)
   Subjective:    Patient ID: Stephanie Colon, female    DOB: 1918/12/28, 81 y.o.   MRN: [id number]  HPI The patient is a 81 YO female who does have dementia. She is also having poor appetite and does forget to eat. She eats with her family who reminds her. She also now has an aide coming in to the house to remind her to eat and help with household tasks. Denies any wandering or behavioral problems.   Review of Systems  Constitutional: Negative.   Respiratory: Negative for cough, chest tightness and shortness of breath.   Cardiovascular: Negative for chest pain, palpitations and leg swelling.  Gastrointestinal: Negative for abdominal pain, diarrhea, constipation and abdominal distention.  Skin: Negative.   Neurological: Negative for dizziness, weakness, light-headedness and headaches.  Psychiatric/Behavioral: Negative.       Objective:   Physical Exam  Constitutional: She appears well-developed and well-nourished.  HENT:  Head: Normocephalic and atraumatic.  Eyes: EOM are normal.  Neck: Normal range of motion.  Cardiovascular: Normal rate.   Pulmonary/Chest: Effort normal and breath sounds normal.  Abdominal: Soft. She exhibits no distension. There is no tenderness.  Neurological: She is alert.  Has cane  Skin: Skin is warm and dry.   Filed Vitals:   11/28/14 1408  BP: 146/82  Pulse: 88  Temp: 97.7 F (36.5 C)  TempSrc: Oral  Resp: 14  Weight: 125 lb (56.7 kg)  SpO2: 96%      Assessment & Plan:

## 2014-11-29 NOTE — Assessment & Plan Note (Signed)
Her dementia is progressing. She is not wandering and not having any behavioral problems. Family is getting help and she is not willing to leave her home. Will make our goal to support her at home as long as safe for her. Talked with family about hospice services once she starts to have more problems.

## 2014-12-03 ENCOUNTER — Other Ambulatory Visit: Payer: Self-pay | Admitting: Internal Medicine

## 2015-03-25 ENCOUNTER — Encounter: Payer: Self-pay | Admitting: Gastroenterology

## 2015-04-22 ENCOUNTER — Encounter: Payer: Self-pay | Admitting: *Deleted

## 2015-05-28 ENCOUNTER — Ambulatory Visit (INDEPENDENT_AMBULATORY_CARE_PROVIDER_SITE_OTHER): Payer: Medicare Other | Admitting: Internal Medicine

## 2015-05-28 ENCOUNTER — Encounter: Payer: Self-pay | Admitting: Internal Medicine

## 2015-05-28 VITALS — BP 138/76 | HR 75 | Temp 98.1°F | Resp 14 | Ht 65.0 in | Wt 130.0 lb

## 2015-05-28 DIAGNOSIS — F411 Generalized anxiety disorder: Secondary | ICD-10-CM

## 2015-05-28 DIAGNOSIS — F03B0 Unspecified dementia, moderate, without behavioral disturbance, psychotic disturbance, mood disturbance, and anxiety: Secondary | ICD-10-CM

## 2015-05-28 DIAGNOSIS — E785 Hyperlipidemia, unspecified: Secondary | ICD-10-CM

## 2015-05-28 DIAGNOSIS — R52 Pain, unspecified: Secondary | ICD-10-CM | POA: Diagnosis not present

## 2015-05-28 DIAGNOSIS — Z23 Encounter for immunization: Secondary | ICD-10-CM

## 2015-05-28 DIAGNOSIS — I10 Essential (primary) hypertension: Secondary | ICD-10-CM

## 2015-05-28 DIAGNOSIS — F0390 Unspecified dementia without behavioral disturbance: Secondary | ICD-10-CM | POA: Diagnosis not present

## 2015-05-28 MED ORDER — VENLAFAXINE HCL ER 37.5 MG PO CP24: 37.5000 mg | ORAL_CAPSULE | Freq: Every day | ORAL | Status: DC

## 2015-05-28 NOTE — Patient Instructions (Addendum)
Start namenda xr titration packet daily up to  daily. Follow up in 4 weeks to re-evaluate  Start low dose effexor xr and will adjust if need be at next appt  Continue other medications as ordered  Follow up in 4 weeks for dementia  Will call with lab results

## 2015-05-28 NOTE — Progress Notes (Signed)
Failed clock drawing  

## 2015-05-28 NOTE — Progress Notes (Signed)
Patient ID: Stephanie Colon, female  KARLENA LUEBKEust 21, 1920, 81 y.o.   MRN: [id number]    Location:    [facility]   Place of Service:  OFFICE    Advanced Directive information Does patient have an advance directive?: Yes, Type of Advance Directive: Healthcare Power of Attorney, Does patient want to make changes to advanced directive?: No - Patient declined  Chief Complaint  Patient presents with  . Establish Care    New patient to establish care: hearing issues and nervous.  . Medical Management of Chronic Issues    HTN, High cholesterol, and memory   . Immunizations    Flu Vaccine today     HPI:  81 yo female seen today as a new pt. Niece, Darel Hong, present and states pt has reduced appetite. She rarely eats meat due to difficulty chewing and has missing teeth in upper mouth. She has been consuming a lot of sweets.  She lives alone. Home instead comes out 3 times per week and help with medication and meals.   HTN - stable on losartan  Anxiety - has taken effexor in the past with relief but med no RF at last OV with previous PCP  Hyperlipidemia - diet controlled  Memory loss -  Short term issues and has worsened over the last 2 yrs. She drinks 2-3 ensures per day.  Glaucoma - uses OTC eye gtts and refuses to f/u with eye specialists  Past Medical History  Diagnosis Date  . ANXIETY   . ASTHMA   . COLONIC POLYPS, HX OF   . GALLSTONE PANCREATITIS 10/2009  . GLUCOMA   . HYPERLIPIDEMIA   . HYPERTENSION     Past Surgical History  Procedure Laterality Date  . Cholecystectomy    . Internal surgery fron chron  1999  . Ovarian cyst removal    . Abdominal hysterectomy    . Appendectomy      Patient Care Team: Newt Lukes, MD as PCP - General  Social History   Social History  . Marital Status: Widowed    Spouse Name: N/A  . Number of Children: N/A  . Years of Education: N/A   Occupational History  . Not on file.   Social History Main Topics  . Smoking status: Former Smoker  -- 1.00 packs/day for 50 years  . Smokeless tobacco: Not on file     Comment: Quit about age 55, Widowed, lives alone in her own home-drives and indep ADLs. Supportive neice Chales Abrahams who assist pt as needed  . Alcohol Use: No  . Drug Use: No  . Sexual Activity: Not on file   Other Topics Concern  . Not on file   Social History Narrative   Diet:Regular   Do you drink/eat things with caffeine? No   Marital status:  Widowed                             What year were you married? 1952   Do you live in a house, apartment, assisted living, condo, trailer, etc)? House   Is it one or more stories? 1   How many persons live in your home?1   Do you have any pets in your home? No   Current or past profession: Manufacturing engineer [employer]   Do you exercise?  No  Type & how often:   Do you have a living will? Yes   Do you have a DNR Form? Yes   Do you have a POA/HPOA forms? Yes        reports that she has quit smoking. She does not have any smokeless tobacco history on file. She reports that she does not drink alcohol or use illicit drugs.  Family History  Problem Relation Age of Onset  . Hypertension Other     other relative  . Stroke Other     other relative  . Diabetes Other     other relative  . Cancer Mother 71    Ovarian  . Heart disease Brother   . Friedreich's ataxia Brother    Family Status  Relation Status Death Age  . Mother Deceased   . Father Deceased 75    Train accident  . Brother Deceased   . Brother Deceased     Immunization History  Administered Date(s) Administered  . Influenza Split 07/07/2011  . Influenza Whole 05/11/2010  . Influenza, High Dose Seasonal PF 05/28/2014  . Influenza,inj,Quad PF,36+ Mos 05/14/2013  . Pneumococcal Polysaccharide-23 08/23/2008    Allergies  Allergen Reactions  . Aspirin     REACTION: weak blood vessels  . Citalopram Hydrobromide   . Fosinopril Sodium     REACTION: unknown     Medications: Patient's Medications  New Prescriptions   No medications on file  Previous Medications   AMBULATORY NON FORMULARY MEDICATION    Medication Name: Angelyn Punt (helps to mellow patient out), 1 by mouth daily   LOSARTAN (COZAAR) 25 MG TABLET    Take 1 tablet (25 mg total) by mouth daily.   MULTIPLE VITAMINS-MINERALS (MULTIVITAMIN WITH MINERALS) TABLET    Take 1 tablet by mouth daily.   POTASSIUM PO    Take by mouth daily.  Modified Medications   No medications on file  Discontinued Medications   MELATONIN-LEMON BALM PO    Take 500 mg by mouth as needed.   VENLAFAXINE XR (EFFEXOR-XR) 75 MG 24 HR CAPSULE    Take 75 mg by mouth daily with breakfast.     Review of Systems  Constitutional: Positive for appetite change (loss).  HENT: Positive for dental problem (bridge) and hearing loss.   Eyes: Positive for itching and visual disturbance (wears glasses).       Dry  Musculoskeletal: Positive for back pain.       Joint stiffness  Skin: Positive for color change (discoloration).       Dry; sores   Neurological: Positive for weakness and headaches.       Loss of balance; memory loss  Psychiatric/Behavioral: Positive for confusion and dysphoric mood (depressio and anxiety; hallucinations; paranoia; fears; obsessions). The patient is nervous/anxious.        Increased stress  per new pt packet  Filed Vitals:   05/28/15 1101  BP: 138/76  Pulse: 75  Temp: 98.1 F (36.7 C)  TempSrc: Oral  Resp: 14  Height: 5\' 5"  (1.651 m)  Weight: 130 lb (58.968 kg)  SpO2: 98%   Body mass index is 21.63 kg/(m^2).  Physical Exam  Constitutional: She appears well-developed. No distress.  Frail appearing in NAD  HENT:  Mouth/Throat: Oropharynx is clear and moist. No oropharyngeal exudate.  HOH; poor dentition  Eyes: No scleral icterus. Right pupil is not reactive (pinpoint). Left pupil is not round and not reactive. Pupils are unequal.  Neck: Neck supple. Carotid bruit is not  present. No tracheal deviation present. No thyromegaly present.  Cardiovascular: Normal rate, regular rhythm and intact distal pulses.  Exam reveals no gallop and no friction rub.   Murmur (1/6 SEM) heard. No LE edema b/l. no calf TTP.   Pulmonary/Chest: Effort normal and breath sounds normal. No stridor. No respiratory distress. She has no wheezes. She has no rales.  Abdominal: Soft. Bowel sounds are normal. She exhibits no distension and no mass. There is no hepatomegaly. There is no tenderness. There is no rebound and no guarding.  Musculoskeletal: She exhibits edema and tenderness.  Lymphadenopathy:    She has no cervical adenopathy.  Neurological: She is alert.  Skin: Skin is warm and dry. No rash noted.  Psychiatric: She has a normal mood and affect. Her behavior is normal.   MMSE - Mini Mental State Exam 05/28/2015  Orientation to time 0  Orientation to Place 3  Registration 3  Attention/ Calculation 5  Recall 2  Language- name 2 objects 2  Language- repeat 0  Language- follow 3 step command 3  Language- read & follow direction 1  Write a sentence 1  Copy design 0  Total score 20     Labs reviewed: No visits with results within 3 Month(s) from this visit. Latest known visit with results is:  Lab on 05/28/2014  Component Date Value Ref Range Status  . Sodium 05/28/2014 137  135 - 145 mEq/L Final  . Potassium 05/28/2014 4.5  3.5 - 5.1 mEq/L Final  . Chloride 05/28/2014 104  96 - 112 mEq/L Final  . CO2 05/28/2014 21  19 - 32 mEq/L Final  . Glucose, Bld 05/28/2014 110* 70 - 99 mg/dL Final  . BUN 13/03/6577 34* 6 - 23 mg/dL Final  . Creatinine, Ser 05/28/2014 1.8* 0.4 - 1.2 mg/dL Final  . Calcium 46/96/2952 10.9* 8.4 - 10.5 mg/dL Final  . GFR 84/13/2440 28.25* >60.00 mL/min Final  . WBC 05/28/2014 7.5  4.0 - 10.5 K/uL Final  . RBC 05/28/2014 4.01  3.87 - 5.11 Mil/uL Final  . Hemoglobin 05/28/2014 12.9  12.0 - 15.0 g/dL Final  . HCT 06/19/2535 38.6  36.0 - 46.0 % Final   . MCV 05/28/2014 96.1  78.0 - 100.0 fl Final  . MCHC 05/28/2014 33.5  30.0 - 36.0 g/dL Final  . RDW 64/40/3474 13.3  11.5 - 15.5 % Final  . Platelets 05/28/2014 354.0  150.0 - 400.0 K/uL Final  . Neutrophils Relative % 05/28/2014 71.2  43.0 - 77.0 % Final  . Lymphocytes Relative 05/28/2014 21.7  12.0 - 46.0 % Final  . Monocytes Relative 05/28/2014 5.7  3.0 - 12.0 % Final  . Eosinophils Relative 05/28/2014 0.9  0.0 - 5.0 % Final  . Basophils Relative 05/28/2014 0.5  0.0 - 3.0 % Final  . Neutro Abs 05/28/2014 5.3  1.4 - 7.7 K/uL Final  . Lymphs Abs 05/28/2014 1.6  0.7 - 4.0 K/uL Final  . Monocytes Absolute 05/28/2014 0.4  0.1 - 1.0 K/uL Final  . Eosinophils Absolute 05/28/2014 0.1  0.0 - 0.7 K/uL Final  . Basophils Absolute 05/28/2014 0.0  0.0 - 0.1 K/uL Final  . Total Bilirubin 05/28/2014 0.5  0.2 - 1.2 mg/dL Final  . Bilirubin, Direct 05/28/2014 0.1  0.0 - 0.3 mg/dL Final  . Alkaline Phosphatase 05/28/2014 69  39 - 117 U/L Final  . AST 05/28/2014 37  0 - 37 U/L Final  . ALT 05/28/2014 21  0 - 35 U/L Final  . Total  Protein 05/28/2014 8.6* 6.0 - 8.3 g/dL Final  . Albumin 40/98/1191 4.5  3.5 - 5.2 g/dL Final  . Cholesterol 47/82/9562 180  0 - 200 mg/dL Final   ATP III Classification       Desirable:  < 200 mg/dL               Borderline High:  200 - 239 mg/dL          High:  > = 130 mg/dL  . Triglycerides 05/28/2014 203.0* 0.0 - 149.0 mg/dL Final   Normal:  <865 mg/dLBorderline High:  150 - 199 mg/dL  . HDL 05/28/2014 51.10  >39.00 mg/dL Final  . VLDL 78/46/9629 40.6* 0.0 - 40.0 mg/dL Final  . Total CHOL/HDL Ratio 05/28/2014 4   Final                  Men          Women1/2 Average Risk     3.4          3.3Average Risk          5.0          4.42X Average Risk          9.6          7.13X Average Risk          15.0          11.0                      . NonHDL 05/28/2014 128.90   Final   NOTE:  Non-HDL goal should be 30 mg/dL higher than patient's LDL goal (i.e. LDL goal of < 70 mg/dL, would  have non-HDL goal of < 100 mg/dL)  . TSH 05/28/2014 1.90  0.35 - 4.50 uIU/mL Final  . Direct LDL 05/28/2014 96.5   Final   Optimal:  <100 mg/dLNear or Above Optimal:  100-129 mg/dLBorderline High:  130-159 mg/dLHigh:  160-189 mg/dLVery High:  >190 mg/dL    No results found.   Assessment/Plan   ICD-9-CM ICD-10-CM   1. Pain, generalized 780.96 R52   2. Moderate dementia, without behavioral disturbance - MMSE 20/30 290.0 F03.90 CMP     Vitamin B12  3. Generalized anxiety disorder - uncontrolled 300.02 F41.1 CMP     TSH     venlafaxine XR (EFFEXOR XR) 37.5 MG 24 hr capsule  4. Essential hypertension - stable 401.9 I10 CBC with Differential     CMP     TSH     Urinalysis with Reflex Microscopic  5. Hyperlipidemia LDL goal <100 - diet controlled 272.4 E78.5 Lipid Panel     TSH    Start namenda xr titration packet daily up to 28mg  daily. Follow up in 4 weeks to re-evaluate  Start low dose effexor xr and will adjust if need be at next appt  Continue other medications as ordered  Continue ensure cans 2-3 per day  Influenza vaccine given today  Follow up in 4 weeks for dementia and anxiety  Emmalise Huard S. Ancil Linsey  [facility] [address] [place], [place] [address] [phone] Cell (Monday-Friday 8 AM - 5 PM) [phone] After 5 PM and follow prompts

## 2015-05-29 ENCOUNTER — Other Ambulatory Visit: Payer: Self-pay | Admitting: *Deleted

## 2015-05-29 ENCOUNTER — Other Ambulatory Visit: Payer: Self-pay | Admitting: Internal Medicine

## 2015-05-29 LAB — CBC WITH DIFFERENTIAL/PLATELET
Basophils Absolute: 0 10*3/uL (ref 0.0–0.2)
Basos: 0 %
EOS (ABSOLUTE): 0.1 10*3/uL (ref 0.0–0.4)
EOS: 1 %
HEMOGLOBIN: 12.3 g/dL (ref 11.1–15.9)
Hematocrit: 37.3 % (ref 34.0–46.6)
Immature Grans (Abs): 0 10*3/uL (ref 0.0–0.1)
Immature Granulocytes: 0 %
LYMPHS ABS: 1.9 10*3/uL (ref 0.7–3.1)
Lymphs: 22 %
MCH: 30.6 pg (ref 26.6–33.0)
MCHC: 33 g/dL (ref 31.5–35.7)
MCV: 93 fL (ref 79–97)
MONOCYTES: 5 %
Monocytes Absolute: 0.4 10*3/uL (ref 0.1–0.9)
NEUTROS ABS: 6 10*3/uL (ref 1.4–7.0)
Neutrophils: 72 %
Platelets: 316 10*3/uL (ref 150–379)
RBC: 4.02 x10E6/uL (ref 3.77–5.28)
RDW: 13.9 % (ref 12.3–15.4)
WBC: 8.5 10*3/uL (ref 3.4–10.8)

## 2015-05-29 LAB — LIPID PANEL
CHOL/HDL RATIO: 4.6 ratio — AB (ref 0.0–4.4)
Cholesterol, Total: 203 mg/dL — ABNORMAL HIGH (ref 100–199)
HDL: 44 mg/dL (ref >39)
LDL Calculated: 87 mg/dL (ref 0–99)
Triglycerides: 359 mg/dL — ABNORMAL HIGH (ref 0–149)
VLDL CHOLESTEROL CAL: 72 mg/dL — AB (ref 5–40)

## 2015-05-29 LAB — VITAMIN B12: Vitamin B-12: 223 pg/mL (ref 211–946)

## 2015-05-29 LAB — COMPREHENSIVE METABOLIC PANEL
ALBUMIN: 4.3 g/dL (ref 3.2–4.6)
ALK PHOS: 77 IU/L (ref 39–117)
ALT: 14 IU/L (ref 0–32)
AST: 21 IU/L (ref 0–40)
Albumin/Globulin Ratio: 1.3 (ref 1.1–2.5)
BILIRUBIN TOTAL: 0.4 mg/dL (ref 0.0–1.2)
BUN / CREAT RATIO: 18 (ref 11–26)
BUN: 25 mg/dL (ref 10–36)
CO2: 22 mmol/L (ref 18–29)
Calcium: 10.8 mg/dL — ABNORMAL HIGH (ref 8.7–10.3)
Chloride: 101 mmol/L (ref 97–108)
Creatinine, Ser: 1.37 mg/dL — ABNORMAL HIGH (ref 0.57–1.00)
GFR calc Af Amer: 38 mL/min/{1.73_m2} — ABNORMAL LOW (ref >59)
GFR calc non Af Amer: 33 mL/min/{1.73_m2} — ABNORMAL LOW (ref >59)
Globulin, Total: 3.3 g/dL (ref 1.5–4.5)
Glucose: 93 mg/dL (ref 65–99)
Potassium: 4.8 mmol/L (ref 3.5–5.2)
SODIUM: 141 mmol/L (ref 134–144)
Total Protein: 7.6 g/dL (ref 6.0–8.5)

## 2015-05-29 LAB — URINALYSIS, ROUTINE W REFLEX MICROSCOPIC
Bilirubin, UA: NEGATIVE
GLUCOSE, UA: NEGATIVE
Leukocytes, UA: NEGATIVE
Nitrite, UA: NEGATIVE
RBC, UA: NEGATIVE
Specific Gravity, UA: 1.026 (ref 1.005–1.030)
Urobilinogen, Ur: 0.2 mg/dL (ref 0.2–1.0)
pH, UA: 5.5 (ref 5.0–7.5)

## 2015-05-29 LAB — MICROSCOPIC EXAMINATION
Bacteria, UA: NONE SEEN
CASTS: NONE SEEN /LPF

## 2015-05-29 LAB — TSH: TSH: 2.79 u[IU]/mL (ref 0.450–4.500)

## 2015-05-29 MED ORDER — VITAMIN B-12 1000 MCG PO TABS: 1000.0000 ug | ORAL_TABLET | Freq: Every day | ORAL | Status: AC

## 2015-06-02 ENCOUNTER — Ambulatory Visit: Payer: Medicare Other

## 2015-07-01 ENCOUNTER — Encounter (HOSPITAL_COMMUNITY): Payer: Self-pay | Admitting: Emergency Medicine

## 2015-07-01 ENCOUNTER — Inpatient Hospital Stay (HOSPITAL_COMMUNITY)
Admission: EM | Admit: 2015-07-01 | Discharge: 2015-07-03 | DRG: 543 | Disposition: A | Discharge status: Home Health | Payer: Medicare Other | Attending: Internal Medicine | Admitting: Internal Medicine

## 2015-07-01 ENCOUNTER — Emergency Department (HOSPITAL_COMMUNITY): Payer: Medicare Other

## 2015-07-01 DIAGNOSIS — F32A Depression, unspecified: Secondary | ICD-10-CM | POA: Diagnosis present

## 2015-07-01 DIAGNOSIS — S32010A Wedge compression fracture of first lumbar vertebra, initial encounter for closed fracture: Secondary | ICD-10-CM | POA: Diagnosis not present

## 2015-07-01 DIAGNOSIS — R778 Other specified abnormalities of plasma proteins: Secondary | ICD-10-CM

## 2015-07-01 DIAGNOSIS — F0280 Dementia in other diseases classified elsewhere without behavioral disturbance: Secondary | ICD-10-CM | POA: Diagnosis present

## 2015-07-01 DIAGNOSIS — I248 Other forms of acute ischemic heart disease: Secondary | ICD-10-CM | POA: Diagnosis present

## 2015-07-01 DIAGNOSIS — M6282 Rhabdomyolysis: Secondary | ICD-10-CM | POA: Diagnosis present

## 2015-07-01 DIAGNOSIS — T796XXA Traumatic ischemia of muscle, initial encounter: Secondary | ICD-10-CM

## 2015-07-01 DIAGNOSIS — I4510 Unspecified right bundle-branch block: Secondary | ICD-10-CM | POA: Diagnosis present

## 2015-07-01 DIAGNOSIS — Z79899 Other long term (current) drug therapy: Secondary | ICD-10-CM

## 2015-07-01 DIAGNOSIS — W1830XA Fall on same level, unspecified, initial encounter: Secondary | ICD-10-CM | POA: Diagnosis not present

## 2015-07-01 DIAGNOSIS — Z515 Encounter for palliative care: Secondary | ICD-10-CM | POA: Insufficient documentation

## 2015-07-01 DIAGNOSIS — F0390 Unspecified dementia without behavioral disturbance: Secondary | ICD-10-CM | POA: Diagnosis present

## 2015-07-01 DIAGNOSIS — M4856XA Collapsed vertebra, not elsewhere classified, lumbar region, initial encounter for fracture: Principal | ICD-10-CM | POA: Diagnosis present

## 2015-07-01 DIAGNOSIS — Z823 Family history of stroke: Secondary | ICD-10-CM

## 2015-07-01 DIAGNOSIS — D631 Anemia in chronic kidney disease: Secondary | ICD-10-CM | POA: Diagnosis present

## 2015-07-01 DIAGNOSIS — Z886 Allergy status to analgesic agent status: Secondary | ICD-10-CM

## 2015-07-01 DIAGNOSIS — Z8249 Family history of ischemic heart disease and other diseases of the circulatory system: Secondary | ICD-10-CM

## 2015-07-01 DIAGNOSIS — D638 Anemia in other chronic diseases classified elsewhere: Secondary | ICD-10-CM | POA: Diagnosis present

## 2015-07-01 DIAGNOSIS — E860 Dehydration: Secondary | ICD-10-CM | POA: Diagnosis present

## 2015-07-01 DIAGNOSIS — N183 Chronic kidney disease, stage 3 unspecified: Secondary | ICD-10-CM | POA: Diagnosis present

## 2015-07-01 DIAGNOSIS — Y92000 Kitchen of unspecified non-institutional (private) residence as  the place of occurrence of the external cause: Secondary | ICD-10-CM

## 2015-07-01 DIAGNOSIS — F411 Generalized anxiety disorder: Secondary | ICD-10-CM | POA: Diagnosis present

## 2015-07-01 DIAGNOSIS — J45909 Unspecified asthma, uncomplicated: Secondary | ICD-10-CM | POA: Diagnosis present

## 2015-07-01 DIAGNOSIS — Z833 Family history of diabetes mellitus: Secondary | ICD-10-CM

## 2015-07-01 DIAGNOSIS — M549 Dorsalgia, unspecified: Secondary | ICD-10-CM | POA: Diagnosis not present

## 2015-07-01 DIAGNOSIS — Z888 Allergy status to other drugs, medicaments and biological substances status: Secondary | ICD-10-CM

## 2015-07-01 DIAGNOSIS — Z7189 Other specified counseling: Secondary | ICD-10-CM | POA: Insufficient documentation

## 2015-07-01 DIAGNOSIS — I129 Hypertensive chronic kidney disease with stage 1 through stage 4 chronic kidney disease, or unspecified chronic kidney disease: Secondary | ICD-10-CM | POA: Diagnosis present

## 2015-07-01 DIAGNOSIS — R748 Abnormal levels of other serum enzymes: Secondary | ICD-10-CM | POA: Diagnosis present

## 2015-07-01 DIAGNOSIS — W19XXXA Unspecified fall, initial encounter: Secondary | ICD-10-CM | POA: Diagnosis present

## 2015-07-01 DIAGNOSIS — F329 Major depressive disorder, single episode, unspecified: Secondary | ICD-10-CM | POA: Diagnosis present

## 2015-07-01 DIAGNOSIS — Z87891 Personal history of nicotine dependence: Secondary | ICD-10-CM

## 2015-07-01 DIAGNOSIS — R7989 Other specified abnormal findings of blood chemistry: Secondary | ICD-10-CM | POA: Diagnosis not present

## 2015-07-01 DIAGNOSIS — G309 Alzheimer's disease, unspecified: Secondary | ICD-10-CM | POA: Diagnosis present

## 2015-07-01 DIAGNOSIS — R413 Other amnesia: Secondary | ICD-10-CM

## 2015-07-01 DIAGNOSIS — I10 Essential (primary) hypertension: Secondary | ICD-10-CM

## 2015-07-01 DIAGNOSIS — Z86010 Personal history of colonic polyps: Secondary | ICD-10-CM

## 2015-07-01 DIAGNOSIS — E785 Hyperlipidemia, unspecified: Secondary | ICD-10-CM | POA: Diagnosis present

## 2015-07-01 DIAGNOSIS — Z8049 Family history of malignant neoplasm of other genital organs: Secondary | ICD-10-CM

## 2015-07-01 DIAGNOSIS — Z66 Do not resuscitate: Secondary | ICD-10-CM | POA: Diagnosis present

## 2015-07-01 DIAGNOSIS — F419 Anxiety disorder, unspecified: Secondary | ICD-10-CM

## 2015-07-01 LAB — CBC WITH DIFFERENTIAL/PLATELET
BASOS PCT: 0 %
Basophils Absolute: 0 10*3/uL (ref 0.0–0.1)
EOS ABS: 0 10*3/uL (ref 0.0–0.7)
Eosinophils Relative: 0 %
HEMATOCRIT: 42.3 % (ref 36.0–46.0)
Hemoglobin: 14.3 g/dL (ref 12.0–15.0)
Lymphocytes Relative: 9 %
Lymphs Abs: 0.9 10*3/uL (ref 0.7–4.0)
MCH: 31.8 pg (ref 26.0–34.0)
MCHC: 33.8 g/dL (ref 30.0–36.0)
MCV: 94 fL (ref 78.0–100.0)
MONO ABS: 0.8 10*3/uL (ref 0.1–1.0)
MONOS PCT: 8 %
Neutro Abs: 8.9 10*3/uL — ABNORMAL HIGH (ref 1.7–7.7)
Neutrophils Relative %: 83 %
Platelets: 285 10*3/uL (ref 150–400)
RBC: 4.5 MIL/uL (ref 3.87–5.11)
RDW: 12.9 % (ref 11.5–15.5)
WBC: 10.7 10*3/uL — ABNORMAL HIGH (ref 4.0–10.5)

## 2015-07-01 LAB — URINALYSIS, ROUTINE W REFLEX MICROSCOPIC
BILIRUBIN URINE: NEGATIVE
GLUCOSE, UA: NEGATIVE mg/dL
KETONES UR: NEGATIVE mg/dL
Leukocytes, UA: NEGATIVE
Nitrite: NEGATIVE
PH: 7 (ref 5.0–8.0)
Protein, ur: 100 mg/dL — AB
SPECIFIC GRAVITY, URINE: 1.018 (ref 1.005–1.030)
Urobilinogen, UA: 0.2 mg/dL (ref 0.0–1.0)

## 2015-07-01 LAB — URINE MICROSCOPIC-ADD ON

## 2015-07-01 LAB — I-STAT TROPONIN, ED: Troponin i, poc: 0.09 ng/mL (ref 0.00–0.08)

## 2015-07-01 LAB — COMPREHENSIVE METABOLIC PANEL
ALBUMIN: 4.3 g/dL (ref 3.5–5.0)
ALT: 27 U/L (ref 14–54)
AST: 53 U/L — ABNORMAL HIGH (ref 15–41)
Alkaline Phosphatase: 85 U/L (ref 38–126)
Anion gap: 9 (ref 5–15)
BILIRUBIN TOTAL: 1.1 mg/dL (ref 0.3–1.2)
BUN: 21 mg/dL — ABNORMAL HIGH (ref 6–20)
CALCIUM: 10.6 mg/dL — AB (ref 8.9–10.3)
CO2: 24 mmol/L (ref 22–32)
CREATININE: 1.36 mg/dL — AB (ref 0.44–1.00)
Chloride: 104 mmol/L (ref 101–111)
GFR calc Af Amer: 37 mL/min — ABNORMAL LOW (ref >60)
GFR calc non Af Amer: 32 mL/min — ABNORMAL LOW (ref >60)
GLUCOSE: 133 mg/dL — AB (ref 65–99)
Potassium: 4.9 mmol/L (ref 3.5–5.1)
SODIUM: 137 mmol/L (ref 135–145)
TOTAL PROTEIN: 8.1 g/dL (ref 6.5–8.1)

## 2015-07-01 LAB — MAGNESIUM: Magnesium: 2 mg/dL (ref 1.7–2.4)

## 2015-07-01 LAB — TROPONIN I
TROPONIN I: 0.09 ng/mL — AB (ref <0.031)
Troponin I: 0.08 ng/mL — ABNORMAL HIGH (ref <0.031)

## 2015-07-01 LAB — TSH: TSH: 0.856 u[IU]/mL (ref 0.350–4.500)

## 2015-07-01 LAB — CK: Total CK: 970 U/L — ABNORMAL HIGH (ref 38–234)

## 2015-07-01 MED ORDER — TRAMADOL HCL 50 MG PO TABS: 50.0000 mg | ORAL_TABLET | Freq: Four times a day (QID) | ORAL | Status: DC | PRN

## 2015-07-01 MED ORDER — SORBITOL 70 % SOLN: 30.0000 mL | Freq: Every day | Status: DC | PRN

## 2015-07-01 MED ORDER — SODIUM CHLORIDE 0.9 % IV BOLUS (SEPSIS)
500.0000 mL | Freq: Once | INTRAVENOUS | Status: AC
  Administered 2015-07-01: 500 mL via INTRAVENOUS

## 2015-07-01 MED ORDER — SODIUM CHLORIDE 0.9 % IJ SOLN
3.0000 mL | Freq: Two times a day (BID) | INTRAMUSCULAR | Status: DC
  Administered 2015-07-01: 3 mL via INTRAVENOUS

## 2015-07-01 MED ORDER — SENNOSIDES-DOCUSATE SODIUM 8.6-50 MG PO TABS: 1.0000 | ORAL_TABLET | Freq: Every evening | ORAL | Status: DC | PRN

## 2015-07-01 MED ORDER — SODIUM CHLORIDE 0.9 % IV SOLN: INTRAVENOUS | Status: DC

## 2015-07-01 MED ORDER — SODIUM CHLORIDE 0.9 % IV SOLN
INTRAVENOUS | Status: DC
  Administered 2015-07-01 – 2015-07-02 (×2): via INTRAVENOUS
  Filled 2015-07-01 (×4): qty 1000

## 2015-07-01 MED ORDER — ALUM & MAG HYDROXIDE-SIMETH 200-200-20 MG/5ML PO SUSP: 30.0000 mL | Freq: Four times a day (QID) | ORAL | Status: DC | PRN

## 2015-07-01 MED ORDER — ONDANSETRON HCL 4 MG PO TABS: 4.0000 mg | ORAL_TABLET | Freq: Four times a day (QID) | ORAL | Status: DC | PRN

## 2015-07-01 MED ORDER — ACETAMINOPHEN 325 MG PO TABS
650.0000 mg | ORAL_TABLET | Freq: Once | ORAL | Status: AC
  Administered 2015-07-01: 650 mg via ORAL
  Filled 2015-07-01: qty 2

## 2015-07-01 MED ORDER — MORPHINE SULFATE (PF) 2 MG/ML IV SOLN: 0.5000 mg | INTRAVENOUS | Status: DC | PRN

## 2015-07-01 MED ORDER — LOSARTAN POTASSIUM 50 MG PO TABS
25.0000 mg | ORAL_TABLET | Freq: Every day | ORAL | Status: DC
Start: 2015-07-01 — End: 2015-07-03
  Administered 2015-07-01 – 2015-07-03 (×3): 25 mg via ORAL
  Filled 2015-07-01 (×3): qty 1

## 2015-07-01 MED ORDER — ALBUTEROL SULFATE (2.5 MG/3ML) 0.083% IN NEBU: 2.5000 mg | INHALATION_SOLUTION | RESPIRATORY_TRACT | Status: DC | PRN

## 2015-07-01 MED ORDER — IBUPROFEN 200 MG PO TABS
400.0000 mg | ORAL_TABLET | Freq: Four times a day (QID) | ORAL | Status: DC | PRN
Start: 2015-07-01 — End: 2015-07-03

## 2015-07-01 MED ORDER — VITAMIN B-12 1000 MCG PO TABS
1000.0000 ug | ORAL_TABLET | Freq: Every day | ORAL | Status: DC
  Administered 2015-07-01 – 2015-07-03 (×3): 1000 ug via ORAL
  Filled 2015-07-01 (×3): qty 1

## 2015-07-01 MED ORDER — ADULT MULTIVITAMIN W/MINERALS CH
1.0000 | ORAL_TABLET | Freq: Every day | ORAL | Status: DC
  Administered 2015-07-01 – 2015-07-03 (×3): 1 via ORAL
  Filled 2015-07-01 (×5): qty 1

## 2015-07-01 MED ORDER — HYDROCODONE-ACETAMINOPHEN 5-325 MG PO TABS
1.0000 | ORAL_TABLET | Freq: Once | ORAL | Status: DC
Start: 2015-07-01 — End: 2015-07-01

## 2015-07-01 MED ORDER — ENOXAPARIN SODIUM 30 MG/0.3ML ~~LOC~~ SOLN
30.0000 mg | SUBCUTANEOUS | Status: DC
  Administered 2015-07-01 – 2015-07-02 (×2): 30 mg via SUBCUTANEOUS
  Filled 2015-07-01 (×2): qty 0.3

## 2015-07-01 MED ORDER — ENSURE ENLIVE PO LIQD
237.0000 mL | Freq: Two times a day (BID) | ORAL | Status: DC
  Administered 2015-07-01 – 2015-07-03 (×4): 237 mL via ORAL

## 2015-07-01 MED ORDER — ONDANSETRON HCL 4 MG/2ML IJ SOLN: 4.0000 mg | Freq: Four times a day (QID) | INTRAMUSCULAR | Status: DC | PRN

## 2015-07-01 MED ORDER — ACETAMINOPHEN 500 MG PO TABS
500.0000 mg | ORAL_TABLET | Freq: Three times a day (TID) | ORAL | Status: DC
  Administered 2015-07-01 – 2015-07-03 (×6): 500 mg via ORAL
  Filled 2015-07-01 (×6): qty 1

## 2015-07-01 NOTE — ED Provider Notes (Signed)
CSN: [id number]     Arrival date & time 07/01/15  1017 History   First MD Initiated Contact with Patient 07/01/15 1018     Chief Complaint  Patient presents with  . Fall  . Back Pain  . Hip Pain     (Consider location/radiation/quality/duration/timing/severity/associated sxs/prior Treatment) Patient is a 81 y.o. female presenting with fall, back pain, and hip pain. The history is provided by the patient.  Fall This is a new (pt states she did not feel well this morning but got up to go to the bathroom and coming back she fell in the kitchen.  she thinks she lost her balance.) problem. The current episode started 1 to 2 hours ago. The problem occurs constantly. The problem has not changed since onset.Pertinent negatives include no chest pain, no abdominal pain and no headaches. Associated symptoms comments: Initially she was not able to get up and crawled to her bedroom but was then able to walk and called 911.  They found her in the bedroom and initially had no complaints but now c/o of back pain and hip pain.  Denies abdominal pain, chest pain, shortness of breath, nausea, vomiting. No syncope prior to the event and denies any head injury or LOC after a fall. The symptoms are aggravated by bending and twisting. The symptoms are relieved by rest. She has tried nothing for the symptoms.  Back Pain Associated symptoms: no abdominal pain, no chest pain, no dysuria, no fever, no headaches and no weakness   Hip Pain Pertinent negatives include no chest pain, no abdominal pain and no headaches.    Past Medical History  Diagnosis Date  . ANXIETY   . ASTHMA   . COLONIC POLYPS, HX OF   . GALLSTONE PANCREATITIS 10/2009  . GLUCOMA   . HYPERLIPIDEMIA   . HYPERTENSION    Past Surgical History  Procedure Laterality Date  . Cholecystectomy    . Internal surgery fron chron  1999  . Ovarian cyst removal    . Abdominal hysterectomy    . Appendectomy     Family History  Problem Relation Age of  Onset  . Hypertension Other     other relative  . Stroke Other     other relative  . Diabetes Other     other relative  . Cancer Mother [phone]    Ovarian  . Heart disease Brother   . Friedreich's ataxia Brother    Social History  Substance Use Topics  . Smoking status: Former Smoker -- 1.00 packs/day for 50 years  . Smokeless tobacco: None     Comment: Quit about age 81, Widowed, lives alone in her own home-drives and indep ADLs. Supportive neice Chales AbrahamsJudy Jones who assist pt as needed  . Alcohol Use: No   OB History    No data available     Review of Systems  Constitutional: Negative.  Negative for fever and unexpected weight change.  HENT: Negative.  Negative for congestion and rhinorrhea.   Eyes: Negative.  Negative for photophobia and redness.  Cardiovascular: Negative for chest pain and leg swelling.  Gastrointestinal: Negative for nausea, vomiting, abdominal pain and diarrhea.  Genitourinary: Negative.  Negative for dysuria, hematuria and flank pain.  Musculoskeletal: Positive for back pain. Negative for neck pain.  Skin: Negative for rash.  Neurological: Negative for weakness, light-headedness and headaches.  All other systems reviewed and are negative.     Allergies  Aspirin; Citalopram hydrobromide; and Fosinopril sodium  Home Medications  Prior to Admission medications   Medication Sig Start Date End Date Taking? Authorizing Provider  AMBULATORY NON FORMULARY MEDICATION Medication Name: Angelyn Punt (helps to mellow patient out), 1 by mouth daily    Historical Provider, MD  losartan (COZAAR) 25 MG tablet Take 1 tablet (25 mg total) by mouth daily. 05/30/15   Newt Lukes, MD  Multiple Vitamins-Minerals (MULTIVITAMIN WITH MINERALS) tablet Take 1 tablet by mouth daily.    Historical Provider, MD  POTASSIUM PO Take by mouth daily.    Historical Provider, MD  venlafaxine XR (EFFEXOR XR) 37.5 MG 24 hr capsule Take 1 capsule (37.5 mg total) by mouth daily with  breakfast. 05/28/15   Kirt Boys, DO  vitamin B-12 (CYANOCOBALAMIN) 1000 MCG tablet Take 1 tablet (1,000 mcg total) by mouth daily. 05/29/15   Kirt Boys, DO   There were no vitals taken for this visit. Physical Exam  Constitutional: She appears well-developed and well-nourished. No distress.  Mild resting tremor in the upper extremities  HENT:  Head: Normocephalic and atraumatic.  Eyes: EOM are normal. Pupils are equal, round, and reactive to light.  Cardiovascular: Normal rate, regular rhythm, normal heart sounds and intact distal pulses.  Exam reveals no friction rub.   No murmur heard. Pulmonary/Chest: Effort normal and breath sounds normal. She has no wheezes. She has no rales.  Abdominal: Soft. Bowel sounds are normal. She exhibits no distension. There is no tenderness. There is no rebound and no guarding.  Musculoskeletal:       Right hip: She exhibits tenderness and bony tenderness. She exhibits normal range of motion and no deformity.       Left hip: She exhibits tenderness and bony tenderness. She exhibits normal range of motion and no deformity.       Thoracic back: She exhibits bony tenderness and pain. She exhibits no deformity and no spasm.       Lumbar back: She exhibits decreased range of motion and bony tenderness.       Back:  Patient is able to completely extend and flex bilateral hips with normal internal and external rotation. Normal flexion and extension of the knees, elbows and shoulders bilaterally. No edema  Neurological: She is alert. No cranial nerve deficit.  Oriented to person and place  Skin: Skin is warm and dry. No rash noted.  Psychiatric: She has a normal mood and affect. Her behavior is normal.  Nursing note and vitals reviewed.   ED Course  Procedures (including critical care time) Labs Review Labs Reviewed  CBC WITH DIFFERENTIAL/PLATELET - Abnormal; Notable for the following:    WBC 10.7 (*)    Neutro Abs 8.9 (*)    All other components  within normal limits  COMPREHENSIVE METABOLIC PANEL - Abnormal; Notable for the following:    Glucose, Bld 133 (*)    BUN 21 (*)    Creatinine, Ser 1.36 (*)    Calcium 10.6 (*)    AST 53 (*)    GFR calc non Af Amer 32 (*)    GFR calc Af Amer 37 (*)    All other components within normal limits  URINALYSIS, ROUTINE W REFLEX MICROSCOPIC (NOT AT [facility]) - Abnormal; Notable for the following:    Hgb urine dipstick SMALL (*)    Protein, ur 100 (*)    All other components within normal limits  CK - Abnormal; Notable for the following:    Total CK 970 (*)    All other components within normal limits  URINE MICROSCOPIC-ADD ON - Abnormal; Notable for the following:    Casts HYALINE CASTS (*)    All other components within normal limits  I-STAT TROPOININ, ED - Abnormal; Notable for the following:    Troponin i, poc 0.09 (*)    All other components within normal limits    Imaging Review Dg Chest 2 View  07/01/2015  CLINICAL DATA:  Fall. Generalized back pain. Right hip pain. Weakness. EXAM: CHEST - 2 VIEW COMPARISON:  Two-view chest x-ray 03/14/2007. FINDINGS: The heart size is normal. Emphysematous changes are again noted. Mild interstitial coarsening is chronic. Pleural calcifications over the lung apices bilaterally are stable. Atherosclerotic changes are noted as well. IMPRESSION: 1. Emphysema. 2. No acute cardiopulmonary disease. 3. Pleural calcifications are stable. 4. Atherosclerosis. Electronically Signed   By: Marin Roberts M.D.   On: 07/01/2015 11:36   Dg Thoracic Spine W/swimmers  07/01/2015  CLINICAL DATA:  Generalized back pain. Fall today. Initial encounter. EXAM: THORACIC SPINE - 3 VIEWS COMPARISON:  CT of the chest 05/04/2009 FINDINGS: Multilevel degenerative endplate changes are present throughout the thoracic spine. Twelve rib-bearing thoracic type vertebral bodies are present. No acute fracture or traumatic subluxation is present. There is focal leftward curvature of the  lumbar spine at T11-T12 as before. Atherosclerotic calcifications are again noted in the aorta. Vascular calcifications or pleural plaques are again seen over the lung apices. IMPRESSION: 1. Stable degenerative changes in the thoracic spine. 2. No acute abnormality. Electronically Signed   By: Marin Roberts M.D.   On: 07/01/2015 11:44   Dg Lumbar Spine Complete  07/01/2015  CLINICAL DATA:  81 year old female who fell this morning. Weakness. Initial encounter. EXAM: LUMBAR SPINE - COMPLETE 4+ VIEW COMPARISON:  Abdominal radiographs 11/03/2009.  Chest CT 05/04/2009. FINDINGS: Osteopenia. Bilateral sacroplasty sequelae appears stable. Normal lumbar segmentation. On the AP view lower thoracic and lumbar vertebral height and alignment appears stable. Chronic severe disc space loss with vacuum disc and endplate spurring. However, on the lateral view mild concave appearance of the L1 superior endplate is new since the 2010 CT. Still, no loss of vertebral body height. Mild multilevel spondylolisthesis in the lumbar spine. Widespread moderate facet hypertrophy. Aortoiliac calcified atherosclerosis noted. New cholecystectomy clips. IMPRESSION: 1. Appearance suspicious for but not diagnostic of mild L1 superior endplate compression fracture. If there is pain, lumbar MRI without contrast or nuclear medicine whole-body bone scan would be confirmatory. 2. No other acute osseous abnormality identified in the lumbar spine. 3. Osteopenia. Previous sacroplasty. Calcified aortic atherosclerosis. Electronically Signed   By: Odessa Fleming M.D.   On: 07/01/2015 11:41   Dg Hips Bilat With Pelvis Min 5 Views  07/01/2015  CLINICAL DATA:  Pain.  No history of recent trauma EXAM: DG HIP (WITH OR WITHOUT PELVIS) 5+V BILAT COMPARISON:  None. FINDINGS: Frontal pelvis as well as frontal and lateral hips bilaterally were obtained. There is no demonstrable fracture or dislocation. There is mild symmetric narrowing of both hip joints. No  erosive change. There are multiple foci of arterial vascular calcification. There are calcifications overlying the sacrum bilaterally, possibly calcified uterine leiomyomas. There is degenerative change in the visualized lumbar spine. IMPRESSION: Symmetric narrowing of both hip joints. No acute fracture or dislocation. Extensive arterial vascular calcification. Electronically Signed   By: Bretta Bang III M.D.   On: 07/01/2015 11:42   I have personally reviewed and evaluated these images and lab results as part of my medical decision-making.   EKG Interpretation   Date/Time:  Tuesday July 01 2015 10:48:52 EST Ventricular Rate:  78 PR Interval:  157 QRS Duration: 123 QT Interval:  426 QTC Calculation: 485 R Axis:   -38 Text Interpretation:  Ectopic atrial rhythm Ventricular premature complex  Right bundle branch block Incomplete right bundle branch block is now  complete since 11-03-09 Confirmed by [facility]  MD, Alphonzo Lemmings ([id number]) on  07/01/2015 11:14:25 AM      MDM   Final diagnoses:  Fall  Compression fracture of L1 lumbar vertebra, closed, initial encounter (HCC)  Fall, initial encounter  Elevated troponin  Traumatic rhabdomyolysis, initial encounter [facility])    Patient is a 81 year old lady with a history of hypertension, dementia who lives at home independently and had a fall today. Unclear what caused her fall. She states she was not feeling well but cannot elaborate and that she felt weak and fell to the ground on the floor. She was to get up and ambulate to her bedroom and then called 911. Patient initially had no complaints however now she is complaining of low back pain, upper back pain and hip pain. She denies chest pain, shortness of breath, abdominal pain, nausea or vomiting.  No history of frequent falls.  On exam patient has thoracic and lumbar tenderness as well as tenderness over bilateral hips. No evidence of head injury and she has no C-spine tenderness.  Given  patient's complaint of not feeling well we'll evaluate for any cardiac or infectious etiology that may be starting.  CBC, CMP, UA, troponin, EKG, chest x-ray, thoracic and lumbar imaging and pelvis imaging. Patient given Tylenol.  12:40 PM Imaging with new L1 superior end plate fracture without other acute findings. Labs consistent with a rhabdomyolysis with a CK of 970 and a mild elevated troponin of 0.09 which may be stress-induced. Will admit patient for observation and pain control  Gwyneth Sprout, MD 07/01/15 1339

## 2015-07-01 NOTE — H&P (Signed)
[facility] History and Physical  Stephanie Sergedna E Rodarte ZOX:[id number] DOB: [birth year] DOA: 07/01/2015  Referring physician: Dr. Anitra LauthPlunkett PCP: Kirt Boysarter, Monica, DO   Chief Complaint: Fall/back pain/hip pain  HPI: Stephanie Colon is a 81 y.o. female  With history of Alzheimer's dementia, hypertension, hyperlipidemia, depression, anxiety, who lives alone and presents to the ED after a fall. Patient and niece give most of the history. Patient stated that she did not feel well on the morning of admission she got up to go to the bathroom and when coming back she fell in the kitchen. Patient states she was able to get up and go to her room where she pushed her life alert button and was subsequently brought to the ED. Patient denied any fevers, no syncopal episode, no chills, no nausea, no vomiting, no chest pain, no shortness of breath, no abdominal pain, no diarrhea, no constipation, no dysuria, no melena, no hematemesis, no hematochezia.  Patient was seen in ED and xrays obtained of pelvis and hips were negative for fracture. CXR negative for acute infiltrate. CK was elevated at 970. Point-of-care troponin was slightly elevated at 0.09. Compressive metabolic profile had a BUN of 21 creatinine of 1.36, AST was 53 ,otherwise was within normal limits. CBC had a white count of 10.7 otherwise was within normal limits. TSH was 0.856. Urinalysis was nitrite negative leukocytes negative. Plain films of the L-spine was suspicious for mild L1 superior endplate compression fracture. While in the ED patient had some complaints of back pain and bilateral hip pain.  [facility] were called to admit the patient for further evaluation and management as patient had an elevated troponin and complaints of back pain.    Review of Systems Per history of present illness otherwise negative. Constitutional:  No weight loss, night sweats, Fevers, chills, fatigue.  HEENT:  No headaches, Difficulty swallowing,Tooth/dental  problems,Sore throat,  No sneezing, itching, ear ache, nasal congestion, post nasal drip,  Cardio-vascular:  No chest pain, Orthopnea, PND, swelling in lower extremities, anasarca, dizziness, palpitations  GI:  No heartburn, indigestion, abdominal pain, nausea, vomiting, diarrhea, change in bowel habits, loss of appetite  Resp:  No shortness of breath with exertion or at rest. No excess mucus, no productive cough, No non-productive cough, No coughing up of blood.No change in color of mucus.No wheezing.No chest wall deformity  Skin:  no rash or lesions.  GU:  no dysuria, change in color of urine, no urgency or frequency. No flank pain.  Musculoskeletal:  No joint pain or swelling. No decreased range of motion. No back pain.  Psych:  No change in mood or affect. No depression or anxiety. No memory loss.   Past Medical History  Diagnosis Date  . ANXIETY   . ASTHMA   . COLONIC POLYPS, HX OF   . GALLSTONE PANCREATITIS 10/2009  . GLUCOMA   . HYPERLIPIDEMIA   . HYPERTENSION   . Dementia 07/01/2015   Past Surgical History  Procedure Laterality Date  . Cholecystectomy    . Internal surgery fron chron  1999  . Ovarian cyst removal    . Abdominal hysterectomy    . Appendectomy     Social History:  reports that she has quit smoking. She does not have any smokeless tobacco history on file. She reports that she does not drink alcohol or use illicit drugs.  Allergies  Allergen Reactions  . Aspirin     REACTION: weak blood vessels  . Citalopram Hydrobromide   . Fosinopril Sodium  REACTION: unknown    Family History  Problem Relation Age of Onset  . Hypertension Other     other relative  . Stroke Other     other relative  . Diabetes Other     other relative  . Cancer Mother 58    Ovarian  . Heart disease Brother   . Friedreich's ataxia Brother    mother deceased age 33 from uterine cancer. Father deceased age 88 after he was hit by a train.  Prior to Admission medications    Medication Sig Start Date End Date Taking? Authorizing Provider  losartan (COZAAR) 25 MG tablet Take 1 tablet (25 mg total) by mouth daily. 05/30/15  Yes Newt Lukes, MD  Multiple Vitamins-Minerals (MULTIVITAMIN WITH MINERALS) tablet Take 1 tablet by mouth daily.   Yes Historical Provider, MD  POTASSIUM PO Take 1 tablet by mouth daily.    Yes Historical Provider, MD  vitamin B-12 (CYANOCOBALAMIN) 1000 MCG tablet Take 1 tablet (1,000 mcg total) by mouth daily. 05/29/15  Yes Kirt Boys, DO  venlafaxine XR (EFFEXOR XR) 37.5 MG 24 hr capsule Take 1 capsule (37.5 mg total) by mouth daily with breakfast. Patient not taking: Reported on 07/01/2015 05/28/15   Kirt Boys, DO   Physical Exam: Filed Vitals:   07/01/15 1048 07/01/15 1055 07/01/15 1302 07/01/15 1550  BP: 146/78 146/78 138/62 167/62  Pulse: 78 71 76 81  Temp: 98.6 F (37 C)   97.2 F (36.2 C)  TempSrc: Oral Oral  Oral  Resp: 31 15 21 20   Height:    5\' 7"  (1.702 m)  Weight:    58.2 kg (128 lb 4.9 oz)  SpO2: 98% 100% 100% 93%    Wt Readings from Last 3 Encounters:  07/01/15 58.2 kg (128 lb 4.9 oz)  05/28/15 58.968 kg (130 lb)  11/28/14 56.7 kg (125 lb)    General:   frail elderly female lying on gurney in no acute cardiopulmonary distress. Speaking in full sentences.  Eyes: PERRLA, EOMI, normal lids, irises & conjunctiva ENT: grossly normal hearing, lips & tongue Neck: no LAD, masses or thyromegaly Cardiovascular: RRR, no m/r/g. No LE edema. Respiratory: CTA bilaterally, no w/r/r. Normal respiratory effort. Abdomen: soft, ntnd, positive bowel sounds, no rebound, no guarding Skin: no rash or induration seen on limited exam Musculoskeletal: grossly normal tone BUE/BLE Psychiatric: grossly normal mood and affect, speech fluent and appropriate Neurologic:  alert. Cranial nerves II through XII grossly intact. No focal deficits.           Labs on Admission:  Basic Metabolic Panel:  Recent Labs Lab 07/01/15 1101    NA 137  K 4.9  CL 104  CO2 24  GLUCOSE 133*  BUN 21*  CREATININE 1.36*  CALCIUM 10.6*   Liver Function Tests:  Recent Labs Lab 07/01/15 1101  AST 53*  ALT 27  ALKPHOS 85  BILITOT 1.1  PROT 8.1  ALBUMIN 4.3   No results for input(s): LIPASE, AMYLASE in the last 168 hours. No results for input(s): AMMONIA in the last 168 hours. CBC:  Recent Labs Lab 07/01/15 1101  WBC 10.7*  NEUTROABS 8.9*  HGB 14.3  HCT 42.3  MCV 94.0  PLT 285   Cardiac Enzymes:  Recent Labs Lab 07/01/15 1101 07/01/15 1430  CKTOTAL 970*  --   TROPONINI  --  0.09*    BNP (last 3 results) No results for input(s): BNP in the last 8760 hours.  ProBNP (last 3 results) No results for  input(s): PROBNP in the last 8760 hours.  CBG: No results for input(s): GLUCAP in the last 168 hours.  Radiological Exams on Admission: Dg Chest 2 View  07/01/2015  CLINICAL DATA:  Fall. Generalized back pain. Right hip pain. Weakness. EXAM: CHEST - 2 VIEW COMPARISON:  Two-view chest x-ray 03/14/2007. FINDINGS: The heart size is normal. Emphysematous changes are again noted. Mild interstitial coarsening is chronic. Pleural calcifications over the lung apices bilaterally are stable. Atherosclerotic changes are noted as well. IMPRESSION: 1. Emphysema. 2. No acute cardiopulmonary disease. 3. Pleural calcifications are stable. 4. Atherosclerosis. Electronically Signed   By: Marin Roberts M.D.   On: 07/01/2015 11:36   Dg Thoracic Spine W/swimmers  07/01/2015  CLINICAL DATA:  Generalized back pain. Fall today. Initial encounter. EXAM: THORACIC SPINE - 3 VIEWS COMPARISON:  CT of the chest 05/04/2009 FINDINGS: Multilevel degenerative endplate changes are present throughout the thoracic spine. Twelve rib-bearing thoracic type vertebral bodies are present. No acute fracture or traumatic subluxation is present. There is focal leftward curvature of the lumbar spine at T11-T12 as before. Atherosclerotic calcifications are  again noted in the aorta. Vascular calcifications or pleural plaques are again seen over the lung apices. IMPRESSION: 1. Stable degenerative changes in the thoracic spine. 2. No acute abnormality. Electronically Signed   By: Marin Roberts M.D.   On: 07/01/2015 11:44   Dg Lumbar Spine Complete  07/01/2015  CLINICAL DATA:  81 year old female who fell this morning. Weakness. Initial encounter. EXAM: LUMBAR SPINE - COMPLETE 4+ VIEW COMPARISON:  Abdominal radiographs 11/03/2009.  Chest CT 05/04/2009. FINDINGS: Osteopenia. Bilateral sacroplasty sequelae appears stable. Normal lumbar segmentation. On the AP view lower thoracic and lumbar vertebral height and alignment appears stable. Chronic severe disc space loss with vacuum disc and endplate spurring. However, on the lateral view mild concave appearance of the L1 superior endplate is new since the 2010 CT. Still, no loss of vertebral body height. Mild multilevel spondylolisthesis in the lumbar spine. Widespread moderate facet hypertrophy. Aortoiliac calcified atherosclerosis noted. New cholecystectomy clips. IMPRESSION: 1. Appearance suspicious for but not diagnostic of mild L1 superior endplate compression fracture. If there is pain, lumbar MRI without contrast or nuclear medicine whole-body bone scan would be confirmatory. 2. No other acute osseous abnormality identified in the lumbar spine. 3. Osteopenia. Previous sacroplasty. Calcified aortic atherosclerosis. Electronically Signed   By: Odessa Fleming M.D.   On: 07/01/2015 11:41   Dg Hips Bilat With Pelvis Min 5 Views  07/01/2015  CLINICAL DATA:  Pain.  No history of recent trauma EXAM: DG HIP (WITH OR WITHOUT PELVIS) 5+V BILAT COMPARISON:  None. FINDINGS: Frontal pelvis as well as frontal and lateral hips bilaterally were obtained. There is no demonstrable fracture or dislocation. There is mild symmetric narrowing of both hip joints. No erosive change. There are multiple foci of arterial vascular  calcification. There are calcifications overlying the sacrum bilaterally, possibly calcified uterine leiomyomas. There is degenerative change in the visualized lumbar spine. IMPRESSION: Symmetric narrowing of both hip joints. No acute fracture or dislocation. Extensive arterial vascular calcification. Electronically Signed   By: Bretta Bang III M.D.   On: 07/01/2015 11:42    EKG: Independently reviewed. Right bundle branch block. Unable to find any old EKG to compare to   Assessment/Plan Principal Problem:   Elevated troponin Active Problems:   Compression fracture of L1 lumbar vertebra [facility]): Probable mild   Fall   Hyperlipidemia   Anxiety state   Essential hypertension   Depressed  Memory problem   Dementia   Elevated CK   CKD (chronic kidney disease) stage 3, GFR 30-59 ml/min  #1 elevated troponin/elevated CK Likely secondary to stress from acute fall. Patient states was able to get up after her fall and was not on the ground for an extended period of time. EKG with a right bundle branch block. Will cycle cardiac enzymes every 6. hours 3. Check a 2-D echo. If 2-D echo is abnormal or cardiac enzymes continue to rise may need cardiology input. Follow for now.  #2 probable mild L1 compression fracture   secondary to fall. Place on Tylenol 500 mg 3 times daily. Ultram as needed. PT/OT. Follow.  #3 fall Mechanical in nature. PT/OT.  #4 chronic kidney disease stage III Stable.  #5 depression/anxiety Stable. Per family patient was started on Effexor however got very confused and was hallucinating and a such discontinued per family. Outpatient follow-up with PCP.  #6 hypertension Stable. Continue home dose Cozaar.  #7 dementia Stable. Outpatient follow-up.  #8 prophylaxis Lovenox for DVT prophylaxis.   Code Status: DO NOT RESUSCITATE DVT Prophylaxis: Lovenox Family Communication: Updated patient and niece at bedside. Disposition Plan: Admit to observation.  Time  spent: 65 mins  [facility] MD [facility] Pager [phone]

## 2015-07-01 NOTE — ED Notes (Signed)
Bed: WA19 Expected date:  Expected time:  Means of arrival:  Comments: fall 

## 2015-07-01 NOTE — ED Notes (Signed)
Patient lives alone here with complaints of fall. Pain to back and right hip.

## 2015-07-01 NOTE — ED Notes (Signed)
1445

## 2015-07-01 NOTE — Progress Notes (Addendum)
Pt, female family member and female family member states pcp recently changed to [facility] Carter (was previously Rene PaciValerie Leschber)  EPIC updated  [phone] pt with compression fracture, pain back and hip  CM spoke with EDP Plunkett and she discussed pt elevated troponin Pt has only been given po pain medication and ordered ivf 500 cc per EDP 1345 CM reviewed in details medicare guidelines, home health [facility](HH) (length of stay in home, [facility] available, coverage, primary caregiver, up to 24 hrs before services may be started) and Private duty nursing (PDN-coverage, length of stay in the home types of staff available). CM reviewed availability of HH SW to assist pcp to get pt to snf (if desired disposition) from the community level. CM provided pt/family (niece and niece husband at bedside) with a list of [place] home health agencies, PDN and a list of home hospice .   Discussed pt to be further evaluated by unit therapists (PT/OT) for recommendation of level of care and share this with attending MD and unit CM

## 2015-07-02 ENCOUNTER — Observation Stay (HOSPITAL_BASED_OUTPATIENT_CLINIC_OR_DEPARTMENT_OTHER): Payer: Medicare Other

## 2015-07-02 ENCOUNTER — Encounter (HOSPITAL_COMMUNITY): Payer: Self-pay | Admitting: Internal Medicine

## 2015-07-02 ENCOUNTER — Ambulatory Visit: Payer: Medicare Other | Admitting: Internal Medicine

## 2015-07-02 DIAGNOSIS — I129 Hypertensive chronic kidney disease with stage 1 through stage 4 chronic kidney disease, or unspecified chronic kidney disease: Secondary | ICD-10-CM | POA: Diagnosis present

## 2015-07-02 DIAGNOSIS — Y92000 Kitchen of unspecified non-institutional (private) residence as  the place of occurrence of the external cause: Secondary | ICD-10-CM | POA: Diagnosis not present

## 2015-07-02 DIAGNOSIS — N183 Chronic kidney disease, stage 3 (moderate): Secondary | ICD-10-CM | POA: Diagnosis present

## 2015-07-02 DIAGNOSIS — Z66 Do not resuscitate: Secondary | ICD-10-CM | POA: Diagnosis present

## 2015-07-02 DIAGNOSIS — W19XXXS Unspecified fall, sequela: Secondary | ICD-10-CM | POA: Diagnosis not present

## 2015-07-02 DIAGNOSIS — E860 Dehydration: Secondary | ICD-10-CM | POA: Diagnosis present

## 2015-07-02 DIAGNOSIS — D631 Anemia in chronic kidney disease: Secondary | ICD-10-CM | POA: Diagnosis present

## 2015-07-02 DIAGNOSIS — I4510 Unspecified right bundle-branch block: Secondary | ICD-10-CM | POA: Diagnosis present

## 2015-07-02 DIAGNOSIS — J45909 Unspecified asthma, uncomplicated: Secondary | ICD-10-CM | POA: Diagnosis present

## 2015-07-02 DIAGNOSIS — Z833 Family history of diabetes mellitus: Secondary | ICD-10-CM | POA: Diagnosis not present

## 2015-07-02 DIAGNOSIS — D638 Anemia in other chronic diseases classified elsewhere: Secondary | ICD-10-CM | POA: Diagnosis present

## 2015-07-02 DIAGNOSIS — Z8249 Family history of ischemic heart disease and other diseases of the circulatory system: Secondary | ICD-10-CM | POA: Diagnosis not present

## 2015-07-02 DIAGNOSIS — F418 Other specified anxiety disorders: Secondary | ICD-10-CM | POA: Diagnosis not present

## 2015-07-02 DIAGNOSIS — G309 Alzheimer's disease, unspecified: Secondary | ICD-10-CM | POA: Diagnosis present

## 2015-07-02 DIAGNOSIS — Z79899 Other long term (current) drug therapy: Secondary | ICD-10-CM | POA: Diagnosis not present

## 2015-07-02 DIAGNOSIS — F0280 Dementia in other diseases classified elsewhere without behavioral disturbance: Secondary | ICD-10-CM | POA: Diagnosis present

## 2015-07-02 DIAGNOSIS — R0600 Dyspnea, unspecified: Secondary | ICD-10-CM

## 2015-07-02 DIAGNOSIS — M4856XA Collapsed vertebra, not elsewhere classified, lumbar region, initial encounter for fracture: Secondary | ICD-10-CM | POA: Diagnosis present

## 2015-07-02 DIAGNOSIS — Z86010 Personal history of colonic polyps: Secondary | ICD-10-CM | POA: Diagnosis not present

## 2015-07-02 DIAGNOSIS — Z515 Encounter for palliative care: Secondary | ICD-10-CM | POA: Diagnosis not present

## 2015-07-02 DIAGNOSIS — I248 Other forms of acute ischemic heart disease: Secondary | ICD-10-CM | POA: Diagnosis present

## 2015-07-02 DIAGNOSIS — Z823 Family history of stroke: Secondary | ICD-10-CM | POA: Diagnosis not present

## 2015-07-02 DIAGNOSIS — S32010S Wedge compression fracture of first lumbar vertebra, sequela: Secondary | ICD-10-CM | POA: Diagnosis not present

## 2015-07-02 DIAGNOSIS — F419 Anxiety disorder, unspecified: Secondary | ICD-10-CM | POA: Diagnosis present

## 2015-07-02 DIAGNOSIS — M6282 Rhabdomyolysis: Secondary | ICD-10-CM | POA: Diagnosis present

## 2015-07-02 DIAGNOSIS — F329 Major depressive disorder, single episode, unspecified: Secondary | ICD-10-CM | POA: Diagnosis present

## 2015-07-02 DIAGNOSIS — Z7189 Other specified counseling: Secondary | ICD-10-CM | POA: Diagnosis not present

## 2015-07-02 DIAGNOSIS — W1830XA Fall on same level, unspecified, initial encounter: Secondary | ICD-10-CM | POA: Diagnosis not present

## 2015-07-02 DIAGNOSIS — Z8049 Family history of malignant neoplasm of other genital organs: Secondary | ICD-10-CM | POA: Diagnosis not present

## 2015-07-02 DIAGNOSIS — M549 Dorsalgia, unspecified: Secondary | ICD-10-CM | POA: Diagnosis present

## 2015-07-02 DIAGNOSIS — Z886 Allergy status to analgesic agent status: Secondary | ICD-10-CM | POA: Diagnosis not present

## 2015-07-02 DIAGNOSIS — Z87891 Personal history of nicotine dependence: Secondary | ICD-10-CM | POA: Diagnosis not present

## 2015-07-02 DIAGNOSIS — Z888 Allergy status to other drugs, medicaments and biological substances status: Secondary | ICD-10-CM | POA: Diagnosis not present

## 2015-07-02 DIAGNOSIS — E785 Hyperlipidemia, unspecified: Secondary | ICD-10-CM | POA: Diagnosis present

## 2015-07-02 DIAGNOSIS — F411 Generalized anxiety disorder: Secondary | ICD-10-CM | POA: Diagnosis present

## 2015-07-02 LAB — CBC
HCT: 35.6 % — ABNORMAL LOW (ref 36.0–46.0)
Hemoglobin: 11.9 g/dL — ABNORMAL LOW (ref 12.0–15.0)
MCH: 31.6 pg (ref 26.0–34.0)
MCHC: 33.4 g/dL (ref 30.0–36.0)
MCV: 94.7 fL (ref 78.0–100.0)
PLATELETS: 258 10*3/uL (ref 150–400)
RBC: 3.76 MIL/uL — AB (ref 3.87–5.11)
RDW: 13.3 % (ref 11.5–15.5)
WBC: 8 10*3/uL (ref 4.0–10.5)

## 2015-07-02 LAB — BASIC METABOLIC PANEL
Anion gap: 7 (ref 5–15)
BUN: 23 mg/dL — AB (ref 6–20)
CALCIUM: 9.4 mg/dL (ref 8.9–10.3)
CHLORIDE: 108 mmol/L (ref 101–111)
CO2: 22 mmol/L (ref 22–32)
CREATININE: 1.26 mg/dL — AB (ref 0.44–1.00)
GFR, EST AFRICAN AMERICAN: 41 mL/min — AB (ref >60)
GFR, EST NON AFRICAN AMERICAN: 35 mL/min — AB (ref >60)
Glucose, Bld: 98 mg/dL (ref 65–99)
Potassium: 4.5 mmol/L (ref 3.5–5.1)
SODIUM: 137 mmol/L (ref 135–145)

## 2015-07-02 LAB — TROPONIN I: TROPONIN I: 0.07 ng/mL — AB (ref <0.031)

## 2015-07-02 MED ORDER — SODIUM CHLORIDE 0.9 % IV SOLN
INTRAVENOUS | Status: DC
  Administered 2015-07-03: 08:00:00 via INTRAVENOUS

## 2015-07-02 NOTE — Progress Notes (Addendum)
Patient ID: Stephanie Colon, female   DOB: 1919/06/02, 81 y.o.   MRN: [id number] [facility] PROGRESS NOTE  Stephanie Colon [id number] DOB: 1919/06/02 DOA: 07/01/2015 PCP: Kirt Boysarter, Monica, DO  Brief narrative:    81 y.o. female with past medical history of Alzheimer's dementia, hypertension, hyperlipidemia, depression, anxiety who [facility] status post fall at home. Imaging studies of pelvis and hips were negative for fracture. Plain films of the L-spine was suspicious for mild L1 superior endplate compression fracture. CXR was negative for acute infiltrate. CK was elevated at 970. Point-of-care troponin was slightly elevated at 0.09. CMP revealed creatinine of 1.36, AST was 53 ,otherwise was within normal limits. CBC showed a white count of 10.7 otherwise was within normal limits.    Anticipated discharge: Needs PT eval for safe discharge plan.   Assessment/Plan:    Principal Problem: Fall / Compression fracture of L1 lumbar vertebra (HCC) -Probably from dementia - Needs PT evaluation  - Continue supportive care  Active Problems: Elevated troponin - Likely demand ischemia from elevated CK, CKD - Troponin 0.09 --> 0.07 - No chest pain - Due to age and comorbidities she is not a candidate for aggressive interventions  - Follow up 2 D ECHO results   Essential hypertension - Continue losartan 25 mg daily    CKD (chronic kidney disease) stage 3, GFR 30-59 ml/min -  Cr 1.36 on admission, improving with IV fluids  Elevated CK - Likely from trauma from fall - Check CK in am  Hypercalcemia - Likely due to dehydration - Continue IV fluids - Follow up Calcium level in am   Anemia of chronic disease  - Due to CKD - Hemoglobin stable   Anxiety and depression - Stable - He is not taking Effexor   Alzheimer dementia  - Stable - Needs PT eval for safe discharge plan   DVT Prophylaxis  - Lovenox subQ   Code Status: DNR/DNI Family Communication:  Family not at  the bedside  Disposition Plan: likely in next 1-2 days, by 07/04/2015.  IV access:  Peripheral IV  Procedures and diagnostic studies:    Dg Chest 2 View 07/01/2015  1. Emphysema. 2. No acute cardiopulmonary disease. 3. Pleural calcifications are stable. 4. Atherosclerosis. Electronically Signed   By: Marin Robertshristopher  Mattern M.D.   On: 07/01/2015 11:36   Dg Thoracic Spine W/swimmers 07/01/2015   1. Stable degenerative changes in the thoracic spine. 2. No acute abnormality. Electronically Signed   By: Marin Robertshristopher  Mattern M.D.   On: 07/01/2015 11:44   Dg Lumbar Spine Complete 07/01/2015 1. Appearance suspicious for but not diagnostic of mild L1 superior endplate compression fracture. If there is pain, lumbar MRI without contrast or nuclear medicine whole-body bone scan would be confirmatory. 2. No other acute osseous abnormality identified in the lumbar spine. 3. Osteopenia. Previous sacroplasty. Calcified aortic atherosclerosis. Electronically Signed   By: Odessa FlemingH  Hall M.D.   On: 07/01/2015 11:41   Dg Hips Bilat With Pelvis Min 5 Views 07/01/2015 Symmetric narrowing of both hip joints. No acute fracture or dislocation. Extensive arterial vascular calcification. Electronically Signed   By: Bretta BangWilliam  Woodruff III M.D.   On: 07/01/2015 11:42    Medical Consultants:  None   Other Consultants:  PT  IAnti-Infectives:   None    Manson PasseyEVINE, Marian Grandt, MD  [facility] Pager [phone]  Time spent in minutes: 25 minutes  If 7PM-7AM, please contact night-coverage [email or web address] Password [email or web address] 07/02/2015, 12:21 PM  HPI/Subjective: No acute overnight events. No respiratory distress.   Objective: Filed Vitals:   07/01/15 1550 07/01/15 2111 07/02/15 0500 07/02/15 0602  BP: 167/62 145/50  143/68  Pulse: 81 82  78  Temp: 97.2 F (36.2 C) 98.2 F (36.8 C)  98.1 F (36.7 C)  TempSrc: Oral Oral  Oral  Resp: Height:  (1.702 m)     Weight: 58.2 kg (128 lb 4.9 oz)  56.5 kg (124 lb 9  oz)   SpO2: 93% 96%  99%    Intake/Output Summary (Last 24 hours) at 07/02/15 1221 Last data filed at 07/02/15 0919  Gross per 24 hour  Intake 1583.75 ml  Output      0 ml  Net 1583.75 ml    Exam:   General:  Pt is alert, not in acute distress  Cardiovascular: Regular rate and rhythm, S1/S2 appreciated   Respiratory: Clear to auscultation bilaterally, no wheezing, no crackles, no rhonchi  Abdomen: Soft, non tender, non distended, bowel sounds present  Extremities: No edema, pulses DP and PT palpable bilaterally  Neuro: Grossly nonfocal  Data Reviewed: Basic Metabolic Panel:  Recent Labs Lab 07/01/15 1101 07/01/15 1649 07/02/15 0200  NA 137  --  137  K 4.9  --  4.5  CL 104  --  108  CO2 24  --  22  GLUCOSE 133*  --  98  BUN 21*  --  23*  CREATININE 1.36*  --  1.26*  CALCIUM 10.6*  --  9.4  MG  --  2.0  --    Liver Function Tests:  Recent Labs Lab 07/01/15 1101  AST 53*  ALT 27  ALKPHOS 85  BILITOT 1.1  PROT 8.1  ALBUMIN 4.3   No results for input(s): LIPASE, AMYLASE in the last 168 hours. No results for input(s): AMMONIA in the last 168 hours. CBC:  Recent Labs Lab 07/01/15 1101 07/02/15 0200  WBC 10.7* 8.0  NEUTROABS 8.9*  --   HGB 14.3 11.9*  HCT 42.3 35.6*  MCV 94.0 94.7  PLT 285 258   Cardiac Enzymes:  Recent Labs Lab 07/01/15 1101 07/01/15 1430 07/01/15 2013 07/02/15 0200  CKTOTAL 970*  --   --   --   TROPONINI  --  0.09* 0.08* 0.07*   BNP: Invalid input(s): POCBNP CBG: No results for input(s): GLUCAP in the last 168 hours.  No results found for this or any previous visit (from the past 240 hour(s)).   Scheduled Meds: . acetaminophen  500 mg Oral TID  . enoxaparin (LOVENOX) injection  30 mg Subcutaneous Q24H  . feeding supplement (ENSURE ENLIVE)  237 mL Oral BID BM  . losartan  25 mg Oral Daily  . multivitamin with minerals  1 tablet Oral Daily  . sodium chloride  3 mL Intravenous Q12H  . vitamin B-12  1,000 mcg Oral  Daily   Continuous Infusions: . sodium chloride 0.9 % 1,000 mL infusion 75 mL/hr at 07/02/15 0545

## 2015-07-02 NOTE — Evaluation (Signed)
Physical Therapy Evaluation Patient Details Name: Stephanie Colon MRN: [id number] DOB: [birth year] Today's Date: 07/02/2015   History of Present Illness  Pt is a 81 year old female with history of Alzheimer's dementia, hypertension, hyperlipidemia, depression, anxiety, who lives alone and presents to the ED after a fall.  Pt admitted with elevated troponin (currently trending down) and back pain. Plain films for L-spine was suspicious for mild L1 superior endplate compression fracture   Clinical Impression  Pt admitted with above diagnosis. Pt currently with functional limitations due to the deficits listed below (see PT Problem List).  Pt will benefit from skilled PT to increase their independence and safety with mobility to allow discharge to the venue listed below.   Pt reports she has family member living in duplex next door.  Recommend 24/7 supervision initially upon d/c due to fall risk and pt with flight of stairs to bedroom at home.  Pt denies dizziness during mobility at time of evaluation however educated pt to wait between transfers if she becomes dizzy later upon d/c.     Follow Up Recommendations Home health PT;Supervision/Assistance - 24 hour    Equipment Recommendations  None recommended by PT    Recommendations for Other Services       Precautions / Restrictions Precautions Precautions: Fall;Back      Mobility  Bed Mobility Overal bed mobility: Needs Assistance Bed Mobility: Rolling;Sidelying to Sit Rolling: Min guard Sidelying to sit: Min guard Supine to sit: Min assist     General bed mobility comments: min/guard for tactile cues to perform log roll technique due to L1 compression fx  Transfers Overall transfer level: Needs assistance Equipment used: Rolling walker (2 wheeled) Transfers: Sit to/from Stand Sit to Stand: Min guard Stand pivot transfers: Min assist       General transfer comment: verbal cues for hand  placement  Ambulation/Gait Ambulation/Gait assistance: Min guard Ambulation Distance (Feet): 90 Feet Assistive device: Rolling walker (2 wheeled) Gait Pattern/deviations: Step-through pattern;Decreased stride length     General Gait Details: verbal cues for safe use of RW  Stairs            Wheelchair Mobility    Modified Rankin (Stroke Patients Only)       Balance                                             Pertinent Vitals/Pain Pain Assessment: No/denies pain    Home Living Family/patient expects to be discharged to:: Private residence Living Arrangements: Alone Available Help at Discharge: Family (reports family lives in unit next door (duplex)) Type of Home: House (Duplex) Home Access: Stairs to enter   [employer] of Steps: 4 Home Layout: Two level Home Equipment: Environmental consultant - 2 wheels      Prior Function Level of Independence: Independent               Hand Dominance        Extremity/Trunk Assessment   Upper Extremity Assessment: Generalized weakness           Lower Extremity Assessment: Generalized weakness         Communication   Communication: No difficulties  Cognition Arousal/Alertness: Awake/alert Behavior During Therapy: WFL for tasks assessed/performed Overall Cognitive Status: No family/caregiver present to determine baseline cognitive functioning (WFL for mobility) Area of Impairment: Orientation;Memory  General Comments      Exercises        Assessment/Plan    PT Assessment Patient needs continued PT services  PT Diagnosis Difficulty walking   PT Problem List Decreased strength;Decreased mobility;Decreased activity tolerance;Decreased knowledge of use of DME  PT Treatment Interventions DME instruction;Gait training;Functional mobility training;Patient/family education;Therapeutic activities;Therapeutic exercise   PT Goals (Current goals can be found in the  Care Plan section) Acute Rehab PT Goals Patient Stated Goal: go home PT Goal Formulation: With patient Time For Goal Achievement: 07/09/15 Potential to Achieve Goals: Good    Frequency Min 3X/week   Barriers to discharge        Co-evaluation               End of Session Equipment Utilized During Treatment: Gait belt Activity Tolerance: Patient tolerated treatment well Patient left: with call bell/phone within reach;in chair;with chair alarm set Nurse Communication: Mobility status    Functional Assessment Tool Used: clinical judgement Functional Limitation: Mobility: Walking and moving around Mobility: Walking and Moving Around Current Status [phone]): At least 1 percent but less than 20 percent impaired, limited or restricted Mobility: Walking and Moving Around Goal Status [phone]([id number]): At least 1 percent but less than 20 percent impaired, limited or restricted    Time: 0916-0931 PT Time Calculation (min) (ACUTE ONLY): 15 min   Charges:   PT Evaluation $Initial PT Evaluation Tier I: 1 Procedure     PT G Codes:   PT G-Codes **NOT FOR INPATIENT CLASS** Functional Assessment Tool Used: clinical judgement Functional Limitation: Mobility: Walking and moving around Mobility: Walking and Moving Around Current Status ([phone]): At least 1 percent but less than 20 percent impaired, limited or restricted Mobility: Walking and Moving Around Goal Status [phone]): At least 1 percent but less than 20 percent impaired, limited or restricted    Marshay Slates,KATHrine E 07/02/2015, 12:53 PM Zenovia JarredKati Karyss Frese, PT, DPT 07/02/2015 Pager: [phone]

## 2015-07-02 NOTE — Progress Notes (Signed)
Initial Nutrition Assessment  DOCUMENTATION CODES:   Not applicable  INTERVENTION:  - Continue Ensure Enlive po BID, each supplement provides 350 kcal and 20 grams of protein - Encourage PO intakes of meals and supplements  - RD will continue to monitor for needs  NUTRITION DIAGNOSIS:   Inadequate oral intake related to poor appetite as evidenced by per patient/family report.  GOAL:   Patient will meet greater than or equal to 90% of their needs  MONITOR:   PO intake, Supplement acceptance, Weight trends, Labs, I & O's  REASON FOR ASSESSMENT:   Malnutrition Screening Tool  ASSESSMENT:   81 year-old female with history of Alzheimer's dementia, hypertension, hyperlipidemia, depression, anxiety, who lives alone and presents to the ED after a fall. Patient and niece give most of the history. Patient stated that she did not feel well on the morning of admission she got up to go to the bathroom and when coming back she fell in the kitchen. Patient states she was able to get up and go to her room where she pushed her life alert button and was subsequently brought to the ED. Patient denied any fevers, no syncopal episode, no chills, no nausea, no vomiting, no chest pain, no shortness of breath, no abdominal pain, no diarrhea, no constipation, no dysuria, no melena, no hematemesis, no hematochezia.   Pt seen for MST. Per chart review, pt consumed 100% of dinner last night. She denies having breakfast yet this AM. She states  That PTA her appetite has been decreased since losing her husband several years ago as she does not enjoy eating alone. Unable to obtain a usual weight hx at this time.   Pt is unsure of weight changes PTA but chart review indicates 6 lb weight loss (5% body weight) in the past 1 month. No muscle or fat wasting present. Unable to confirm malnutrition at this time.  Will order Ensure Enlive BID. Medications reviewed. Labs reviewed.   Diet Order:  Diet regular Room  service appropriate?: Yes with Assist; Fluid consistency:: Thin  Skin:  Reviewed, no issues  Last BM:  11/8  Height:   Ht Readings from Last 1 Encounters:  07/01/15 5\' 7"  (1.702 m)    Weight:   Wt Readings from Last 1 Encounters:  07/02/15 124 lb 9 oz (56.5 kg)    Ideal Body Weight:  61.36 kg (kg)  BMI:  Body mass index is 19.5 kg/(m^2).  Estimated Nutritional Needs:   Kcal:  1200-1400  Protein:  50-60 grams  Fluid:  1.8-2 L/day  EDUCATION NEEDS:   No education needs identified at this time      Trenton GammonJessica Kacyn Souder, RD, LDN Inpatient Clinical Dietitian Pager # [phone] After hours/weekend pager # [phone]

## 2015-07-02 NOTE — Evaluation (Signed)
Occupational Therapy Evaluation Patient Details Name: Stephanie Colon MRN: [id number] DOB: 1918-11-24 Today's Date: 07/02/2015    History of Present Illness Pt is a 81 year old female with history of Alzheimer's dementia, hypertension, hyperlipidemia, depression, anxiety, who lives alone and presents to the ED after a fall.  Pt admitted with elevated troponin (currently trending down) and back pain. Plain films for L-spine was suspicious for mild L1 superior endplate compression fracture    Clinical Impression   Pt admitted with fall. Pt currently with functional limitations due to the deficits listed below (see OT Problem List).  Pt will benefit from skilled OT to increase their safety and independence with ADL and functional mobility for ADL to facilitate discharge to venue listed below.      Follow Up Recommendations  Home health OT;SNF;Supervision/Assistance - 24 hour (24 hour A or ST SN[facility])    Equipment Recommendations  None recommended by OT       Precautions / Restrictions Precautions Precautions: Fall;Back      Mobility Bed Mobility Overal bed mobility: Needs Assistance Bed Mobility: Supine to Sit     Supine to sit: Min assist        Transfers Overall transfer level: Needs assistance Equipment used: 1 person hand held assist Transfers: Sit to/from [employer] Sit to Stand: Min assist Stand pivot transfers: Min assist       General transfer comment: VC for safety.    Balance                                            ADL Overall ADL's : Needs assistance/impaired Eating/Feeding: Set up;Sitting   Grooming: Set up;Sitting   Upper Body Bathing: Minimal assitance;Sitting   Lower Body Bathing: Moderate assistance;Sit to/from stand;Cueing for safety   Upper Body Dressing : Set up;Sitting   Lower Body Dressing: Moderate assistance;Sit to/from stand   Toilet Transfer: Minimal assistance;BSC;Cueing for sequencing;Cueing for  safety   Toileting- Clothing Manipulation and Hygiene: Minimal assistance;Cueing for safety;Sit to/from stand                         Pertinent Vitals/Pain Pain Assessment: No/denies pain     Hand Dominance     Extremity/Trunk Assessment Upper Extremity Assessment Upper Extremity Assessment: Generalized weakness           Communication Communication Communication: No difficulties   Cognition Arousal/Alertness: Awake/alert Behavior During Therapy: WFL for tasks assessed/performed Overall Cognitive Status: Impaired/Different from baseline (caregiver reports pts with increased confusion) Area of Impairment: Orientation;Memory                              Home Living Family/patient expects to be discharged to:: Private residence Living Arrangements: Alone Available Help at Discharge: Family (reports family lives in unit next door (duplex)) Type of Home: House (Duplex) Home Access: Stairs to enter [employer] of Steps: 4   Home Layout: Two level Alternate Level Stairs-Number of Steps: flight             Home Equipment: Walker - 2 wheels          Prior Functioning/Environment Level of Independence: Independent             OT Diagnosis: Generalized weakness;Altered mental status   OT Problem List: Decreased strength;Decreased activity  tolerance;Decreased safety awareness;Decreased cognition   OT Treatment/Interventions: Self-care/ADL training;DME and/or AE instruction;Patient/family education    OT Goals(Current goals can be found in the care plan section) Acute Rehab OT Goals Patient Stated Goal: go home OT Goal Formulation: With patient Time For Goal Achievement: 07/16/15 Potential to Achieve Goals: Good ADL Goals Pt Will Perform Grooming: with supervision;standing Pt Will Perform Lower Body Dressing: with supervision;sit to/from stand Pt Will Transfer to Toilet: with supervision;ambulating;regular height toilet Pt Will  Perform Toileting - Clothing Manipulation and hygiene: with supervision;sit to/from stand  OT Frequency: Min 2X/week   Barriers to D/C:               End of Session Nurse Communication: Mobility status  Activity Tolerance: Patient tolerated treatment well Patient left: in chair;with call bell/phone within reach;with chair alarm set;with family/visitor present   Time: [id number] OT Time Calculation (min): 24 min Charges:  OT General Charges $OT Visit: 1 Procedure OT Evaluation $Initial OT Evaluation Tier I: 1 Procedure OT Treatments $Self Care/Home Management : 8-22 mins G-Codes:    Einar CrowEDDING, Lita Flynn D 07/02/2015, 11:36 AM

## 2015-07-02 NOTE — Progress Notes (Signed)
Echocardiogram 2D Echocardiogram has been performed.  Nolon RodBrown, Tony 07/02/2015, 1:26 PM

## 2015-07-02 NOTE — Care Management Obs Status (Signed)
MEDICARE OBSERVATION STATUS NOTIFICATION   Patient Details  Name: Stephanie Colon MRN: [id number] Date of Birth: 24-Sep-1918   Medicare Observation Status Notification Given:    yes   Geni BersMcGibboney, Wava Kildow, RN 07/02/2015, 2:41 PM

## 2015-07-03 DIAGNOSIS — W19XXXS Unspecified fall, sequela: Secondary | ICD-10-CM

## 2015-07-03 DIAGNOSIS — Z515 Encounter for palliative care: Secondary | ICD-10-CM

## 2015-07-03 DIAGNOSIS — D638 Anemia in other chronic diseases classified elsewhere: Secondary | ICD-10-CM | POA: Diagnosis not present

## 2015-07-03 DIAGNOSIS — S32010S Wedge compression fracture of first lumbar vertebra, sequela: Secondary | ICD-10-CM

## 2015-07-03 DIAGNOSIS — F418 Other specified anxiety disorders: Secondary | ICD-10-CM | POA: Diagnosis not present

## 2015-07-03 DIAGNOSIS — Z7189 Other specified counseling: Secondary | ICD-10-CM

## 2015-07-03 DIAGNOSIS — N183 Chronic kidney disease, stage 3 (moderate): Secondary | ICD-10-CM | POA: Diagnosis not present

## 2015-07-03 MED ORDER — ENSURE ENLIVE PO LIQD: 237.0000 mL | Freq: Two times a day (BID) | ORAL | Status: AC

## 2015-07-03 MED ORDER — ACETAMINOPHEN 500 MG PO TABS: 500.0000 mg | ORAL_TABLET | Freq: Three times a day (TID) | ORAL | Status: DC

## 2015-07-03 MED ORDER — TRAMADOL HCL 50 MG PO TABS: 50.0000 mg | ORAL_TABLET | Freq: Four times a day (QID) | ORAL | Status: DC | PRN

## 2015-07-03 MED ORDER — SENNOSIDES-DOCUSATE SODIUM 8.6-50 MG PO TABS: 1.0000 | ORAL_TABLET | Freq: Every evening | ORAL | Status: DC | PRN

## 2015-07-03 NOTE — Discharge Summary (Signed)
Physician Discharge Summary  Stephanie Colon WNU:[id number] DOB: 02/10/1919 DOA: 07/01/2015  PCP: Kirt Boys, DO  Admit date: 07/01/2015 Discharge date: 07/03/2015  Recommendations for Outpatient Follow-up:  1. Home health orders including physical therapy, occupational therapy, home health aide, social work and nurse all placed for assistance with daily activities and evaluation of home safety  Discharge Diagnoses:  Principal Problem:   Fall Active Problems:   Elevated troponin   Essential hypertension   Dementia   Compression fracture of L1 lumbar vertebra (HCC): Probable mild   Elevated CK   CKD (chronic kidney disease) stage 3, GFR 30-59 ml/min   Anxiety and depression   Hypercalcemia   Anemia of chronic disease    Discharge Condition: stable   Diet recommendation: as tolerated   History of present illness:  81 y.o. female with past medical history of Alzheimer's dementia, hypertension, hyperlipidemia, depression, anxiety who presented to [facility] ED status post fall at home. Imaging studies of pelvis and hips were negative for fracture. Plain films of the L-spine was suspicious for mild L1 superior endplate compression fracture. CXR was negative for acute infiltrate. CK was elevated at 970. Point-of-care troponin was slightly elevated at 0.09. CMP revealed creatinine of 1.36, AST was 53 ,otherwise was within normal limits. CBC showed a white count of 10.7 otherwise was within normal limits.   Hospital Course:   Assessment/Plan:    Principal Problem: Fall / Compression fracture of L1 lumbar vertebra (HCC) - Likely related to history of Alzheimer's dementia. No acute fractures on imaging studies. - Patient was seen by physical therapy who recommended assistance 24 hours with home health PT. Orders placed. - I spoke with the patient's niece at the bedside who said they do not have financial resources to place patient in skilled nursing facility. In addition they do not have  financial resources to hire extra help at home. I spoke with her and told her home health orders placed.  Active Problems: Elevated troponin - Likely demand ischemia from elevated CK, CKD - Troponin 0.09 --> 0.07 - Due to age and comorbidities she is not a candidate for aggressive interventions  -  2-D echo stable, preserved ejection fraction, grade 1 diastolic dysfunction  Essential hypertension - Continue losartan 25 mg daily  on discharge  CKD (chronic kidney disease) stage 3, GFR 30-59 ml/min - Cr 1.36 on admission, improved with IV fluids, 1.26.   Elevated CK - Likely from trauma from fall - Since patient would not have aggressive interventions because of age and other comorbidities we did not repeat CK level. She has received appropriate fluids during this hospital stay.  Hypercalcemia - Likely due to dehydration -  calcium normalized with IV fluids  Anemia of chronic disease  - Secondary to anemia of chronic kidney disease  - Hemoglobin stable  at 11.9  Anxiety and depression - Stable  Alzheimer dementia  - Stable - Home health orders all placed    DVT Prophylaxis  - Lovenox subQ in hospital    Code Status: DNR/DNI Family Communication: Spoke with the patient's niece at the bedside   IV access:  Peripheral IV  Procedures and diagnostic studies:   Dg Chest 2 View 07/01/2015 1. Emphysema. 2. No acute cardiopulmonary disease. 3. Pleural calcifications are stable. 4. Atherosclerosis. Electronically Signed By: Marin Roberts M.D. On: 07/01/2015 11:36   Dg Thoracic Spine W/swimmers 07/01/2015 1. Stable degenerative changes in the thoracic spine. 2. No acute abnormality. Electronically Signed By: Marin Roberts M.D. On: 07/01/2015 11:44  Dg Lumbar Spine Complete 07/01/2015 1. Appearance suspicious for but not diagnostic of mild L1 superior endplate compression fracture. If there is pain, lumbar MRI without contrast or nuclear  medicine whole-body bone scan would be confirmatory. 2. No other acute osseous abnormality identified in the lumbar spine. 3. Osteopenia. Previous sacroplasty. Calcified aortic atherosclerosis. Electronically Signed By: Odessa Fleming M.D. On: 07/01/2015 11:41   Dg Hips Bilat With Pelvis Min 5 Views 07/01/2015 Symmetric narrowing of both hip joints. No acute fracture or dislocation. Extensive arterial vascular calcification. Electronically Signed By: Bretta Bang III M.D. On: 07/01/2015 11:42    Medical Consultants:  None   Other Consultants:  PT  IAnti-Infectives:   None   Signed:  Manson Passey, MD  [facility] 07/03/2015, 10:07 AM  Pager #: [phone]  Time spent in minutes: more than 30 minutes    Discharge Exam: Filed Vitals:   07/03/15 0638  BP: 165/78  Pulse: 71  Temp: 98.6 F (37 C)  Resp: 18   Filed Vitals:   07/02/15 0602 07/02/15 1408 07/02/15 2052 07/03/15 0638  BP: 143/68 136/66 151/61 165/78  Pulse: 78 83 79 71  Temp: 98.1 F (36.7 C) 98 F (36.7 C) 98.4 F (36.9 C) 98.6 F (37 C)  TempSrc: Oral Oral Oral Oral  Resp: Height:      Weight:    56.3 kg (124 lb 1.9 oz)  SpO2: 99% 97% 98% 97%    General: Pt is alert, not in acute distress Cardiovascular: Regular rate and rhythm, S1/S2 appreciated Respiratory: Clear to auscultation bilaterally, no wheezing, no crackles, no rhonchi Abdominal: Soft, non tender, non distended, bowel sounds +, no guarding Extremities: no edema, no cyanosis, pulses palpable bilaterally DP and PT Neuro: Grossly nonfocal  Discharge Instructions  Discharge Instructions    Call MD for:  difficulty breathing, headache or visual disturbances    Complete by:  As directed      Call MD for:  persistant dizziness or light-headedness    Complete by:  As directed      Call MD for:  persistant nausea and vomiting    Complete by:  As directed      Call MD for:  severe uncontrolled pain     Complete by:  As directed      Diet - low sodium heart healthy    Complete by:  As directed      Increase activity slowly    Complete by:  As directed             Medication List    STOP taking these medications        venlafaxine XR 37.5 MG 24 hr capsule  Commonly known as:  EFFEXOR XR      TAKE these medications        acetaminophen 500 MG tablet  Commonly known as:  TYLENOL  Take 1 tablet (500 mg total) by mouth 3 (three) times daily.     feeding supplement (ENSURE ENLIVE) Liqd  Take 237 mLs by mouth 2 (two) times daily between meals.     losartan 25 MG tablet  Commonly known as:  COZAAR  Take 1 tablet (25 mg total) by mouth daily.     multivitamin with minerals tablet  Take 1 tablet by mouth daily.     POTASSIUM PO  Take 1 tablet by mouth daily.     senna-docusate 8.6-50 MG tablet  Commonly known as:  Senokot-S  Take 1 tablet by  mouth at bedtime as needed for mild constipation.     traMADol 50 MG tablet  Commonly known as:  ULTRAM  Take 1 tablet (50 mg total) by mouth every 6 (six) hours as needed for moderate pain.     vitamin B-12 1000 MCG tablet  Commonly known as:  CYANOCOBALAMIN  Take 1 tablet (1,000 mcg total) by mouth daily.           Follow-up Information    Follow up with Kirt Boysarter, Monica, DO. Schedule an appointment as soon as possible for a visit in 1 week.   Specialty:  Internal Medicine   Why:  Follow up appt after recent hospitalization   Contact information:   [address] [place] [id number] [phone]        The results of significant diagnostics from this hospitalization (including imaging, microbiology, ancillary and laboratory) are listed below for reference.    Significant Diagnostic Studies: Dg Chest 2 View  07/01/2015  CLINICAL DATA:  Fall. Generalized back pain. Right hip pain. Weakness. EXAM: CHEST - 2 VIEW COMPARISON:  Two-view chest x-ray 03/14/2007. FINDINGS: The heart size is normal. Emphysematous changes are  again noted. Mild interstitial coarsening is chronic. Pleural calcifications over the lung apices bilaterally are stable. Atherosclerotic changes are noted as well. IMPRESSION: 1. Emphysema. 2. No acute cardiopulmonary disease. 3. Pleural calcifications are stable. 4. Atherosclerosis. Electronically Signed   By: Marin Robertshristopher  Mattern M.D.   On: 07/01/2015 11:36   Dg Thoracic Spine W/swimmers  07/01/2015  CLINICAL DATA:  Generalized back pain. Fall today. Initial encounter. EXAM: THORACIC SPINE - 3 VIEWS COMPARISON:  CT of the chest 05/04/2009 FINDINGS: Multilevel degenerative endplate changes are present throughout the thoracic spine. Twelve rib-bearing thoracic type vertebral bodies are present. No acute fracture or traumatic subluxation is present. There is focal leftward curvature of the lumbar spine at T11-T12 as before. Atherosclerotic calcifications are again noted in the aorta. Vascular calcifications or pleural plaques are again seen over the lung apices. IMPRESSION: 1. Stable degenerative changes in the thoracic spine. 2. No acute abnormality. Electronically Signed   By: Marin Robertshristopher  Mattern M.D.   On: 07/01/2015 11:44   Dg Lumbar Spine Complete  07/01/2015  CLINICAL DATA:  81 year old female who fell this morning. Weakness. Initial encounter. EXAM: LUMBAR SPINE - COMPLETE 4+ VIEW COMPARISON:  Abdominal radiographs 11/03/2009.  Chest CT 05/04/2009. FINDINGS: Osteopenia. Bilateral sacroplasty sequelae appears stable. Normal lumbar segmentation. On the AP view lower thoracic and lumbar vertebral height and alignment appears stable. Chronic severe disc space loss with vacuum disc and endplate spurring. However, on the lateral view mild concave appearance of the L1 superior endplate is new since the 2010 CT. Still, no loss of vertebral body height. Mild multilevel spondylolisthesis in the lumbar spine. Widespread moderate facet hypertrophy. Aortoiliac calcified atherosclerosis noted. New cholecystectomy  clips. IMPRESSION: 1. Appearance suspicious for but not diagnostic of mild L1 superior endplate compression fracture. If there is pain, lumbar MRI without contrast or nuclear medicine whole-body bone scan would be confirmatory. 2. No other acute osseous abnormality identified in the lumbar spine. 3. Osteopenia. Previous sacroplasty. Calcified aortic atherosclerosis. Electronically Signed   By: Odessa FlemingH  Hall M.D.   On: 07/01/2015 11:41   Dg Hips Bilat With Pelvis Min 5 Views  07/01/2015  CLINICAL DATA:  Pain.  No history of recent trauma EXAM: DG HIP (WITH OR WITHOUT PELVIS) 5+V BILAT COMPARISON:  None. FINDINGS: Frontal pelvis as well as frontal and lateral hips bilaterally were obtained. There  is no demonstrable fracture or dislocation. There is mild symmetric narrowing of both hip joints. No erosive change. There are multiple foci of arterial vascular calcification. There are calcifications overlying the sacrum bilaterally, possibly calcified uterine leiomyomas. There is degenerative change in the visualized lumbar spine. IMPRESSION: Symmetric narrowing of both hip joints. No acute fracture or dislocation. Extensive arterial vascular calcification. Electronically Signed   By: Bretta Bang III M.D.   On: 07/01/2015 11:42    Microbiology: No results found for this or any previous visit (from the past 240 hour(s)).   Labs: Basic Metabolic Panel:  Recent Labs Lab 07/01/15 1101 07/01/15 1649 07/02/15 0200  NA 137  --  137  K 4.9  --  4.5  CL 104  --  108  CO2 24  --  22  GLUCOSE 133*  --  98  BUN 21*  --  23*  CREATININE 1.36*  --  1.26*  CALCIUM 10.6*  --  9.4  MG  --  2.0  --    Liver Function Tests:  Recent Labs Lab 07/01/15 1101  AST 53*  ALT 27  ALKPHOS 85  BILITOT 1.1  PROT 8.1  ALBUMIN 4.3   No results for input(s): LIPASE, AMYLASE in the last 168 hours. No results for input(s): AMMONIA in the last 168 hours. CBC:  Recent Labs Lab 07/01/15 1101 07/02/15 0200  WBC  10.7* 8.0  NEUTROABS 8.9*  --   HGB 14.3 11.9*  HCT 42.3 35.6*  MCV 94.0 94.7  PLT 285 258   Cardiac Enzymes:  Recent Labs Lab 07/01/15 1101 07/01/15 1430 07/01/15 2013 07/02/15 0200  CKTOTAL 970*  --   --   --   TROPONINI  --  0.09* 0.08* 0.07*   BNP: BNP (last 3 results) No results for input(s): BNP in the last 8760 hours.  ProBNP (last 3 results) No results for input(s): PROBNP in the last 8760 hours.  CBG: No results for input(s): GLUCAP in the last 168 hours.

## 2015-07-03 NOTE — Progress Notes (Signed)
Occupational Therapy Treatment Patient Details Name: Stephanie Colon MRN: [id number] DOB: 09-Dec-1918 Today's Date: 07/03/2015    History of present illness Pt is a 81 year old female with history of Alzheimer's dementia, hypertension, hyperlipidemia, depression, anxiety, who lives alone and presents to the ED after a fall.  Pt admitted with elevated troponin (currently trending down) and back pain. Plain films for L-spine was suspicious for mild L1 superior endplate compression fracture    OT comments  Pt needs 24/7 A or SNF  Follow Up Recommendations  SNF (needs SNF if not 24/7 A)    Equipment Recommendations  None recommended by OT    Recommendations for Other Services      Precautions / Restrictions Precautions Precautions: Fall;Back       Mobility Bed Mobility Overal bed mobility: Needs Assistance Bed Mobility: Rolling;Sidelying to Sit Rolling: Min guard Sidelying to sit: Mod assist       General bed mobility comments: min/guard for tactile cues to perform log roll technique due to L1 compression fx  Transfers Overall transfer level: Needs assistance Equipment used: 1 person hand held assist Transfers: Sit to/from [employer]Stand;Stand Pivot Transfers Sit to Stand: Mod assist Stand pivot transfers: Mod assist       General transfer comment: verbal cues for hand placement        ADL                   Upper Body Dressing : Minimal assistance;Sitting   Lower Body Dressing: Moderate assistance;Sit to/from stand   Toilet Transfer: Moderate assistance;BSC;Cueing for sequencing;Cueing for safety   Toileting- Clothing Manipulation and Hygiene: Moderate assistance;Sit to/from stand;Cueing for sequencing       Functional mobility during ADLs: Moderate assistance General ADL Comments: pt needing increased A this day.  Shared with RN CM. Pt is NOT SAFE to DC home alone and she only has 3 hours of help 3 day s a week.  Pt needs 24/7 A      Cognition   Behavior  During Therapy: Flat affect                                 General Comments  pt with increased fatigue    Pertinent Vitals/ Pain       Faces Pain Scale: Hurts little more Pain Location: back Pain Descriptors / Indicators: Grimacing Pain Intervention(s): Monitored during session;Repositioned            Progress Toward Goals  OT Goals(current goals can now be found in the care plan section)  Progress towards OT goals: Not progressing toward goals - comment (pt fatigued this day)     Plan Discharge plan needs to be updated       End of Session     Activity Tolerance Patient limited by fatigue   Patient Left in chair;with call bell/phone within reach;with chair alarm set;with family/visitor present   Nurse Communication Mobility status        Time: [id number] OT Time Calculation (min): 18 min  Charges: OT General Charges $OT Visit: 1 Procedure OT Treatments $Self Care/Home Management : 8-22 mins  Kailand Seda, Stephanie GoldenLorraine Colon 07/03/2015, 12:11 PM

## 2015-07-03 NOTE — Progress Notes (Signed)
Spoke with pt's niece Darel HongJudy at bedside concerning [facility]. Darel HongJudy states, pt is unable to pay for SNF and will take her home.  I encouraged Darel HongJudy to check with [facility], [facility], [facility] for more hours, or a private sitter to increase coverage for pt while at home. [facility].

## 2015-07-03 NOTE — Progress Notes (Signed)
CSW received referral for new SNF.   CSW reviewed chart and noted that pt discharging today. Pt does not have 3 night inpatient stay in order for Medicare to cover SNF placement. Per MD discharge summary, patient's niece at the bedside who said they do not have financial resources to place patient in skilled nursing facility.  CSW discussed with RN case manager who plans to see pt and pt niece regarding home health services including home health social worker to assist with resources and placement from home if desired.  No social work needs identified at this time as home health social work ordered and [facility] assisting with home health needs.   CSW signing off.  Loletta SpecterSuzanna Abygale Karpf, MSW, LCSW Clinical Social Work [phone]

## 2015-07-03 NOTE — Discharge Instructions (Signed)
Fall Prevention in the Home  Falls can cause injuries and can affect people from all age groups. There are many simple things that you can do to make your home safe and to help prevent falls. WHAT CAN I DO ON THE OUTSIDE OF MY HOME?  Regularly repair the edges of walkways and driveways and fix any cracks.  Remove high doorway thresholds.  Trim any shrubbery on the main path into your home.  Use bright outdoor lighting.  Clear walkways of debris and clutter, including tools and rocks.  Regularly check that handrails are securely fastened and in good repair. Both sides of any steps should have handrails.  Install guardrails along the edges of any raised decks or porches.  Have leaves, snow, and ice cleared regularly.  Use sand or salt on walkways during winter months.  In the garage, clean up any spills right away, including grease or oil spills. WHAT CAN I DO IN THE BATHROOM?  Use night lights.  Install grab bars by the toilet and in the tub and shower. Do not use towel bars as grab bars.  Use non-skid mats or decals on the floor of the tub or shower.  If you need to sit down while you are in the shower, use a plastic, non-slip stool..  Keep the floor dry. Immediately clean up any water that spills on the floor.  Remove soap buildup in the tub or shower on a regular basis.  Attach bath mats securely with double-sided non-slip rug tape.  Remove throw rugs and other tripping hazards from the floor. WHAT CAN I DO IN THE BEDROOM?  Use night lights.  Make sure that a bedside light is easy to reach.  Do not use oversized bedding that drapes onto the floor.  Have a firm chair that has side arms to use for getting dressed.  Remove throw rugs and other tripping hazards from the floor. WHAT CAN I DO IN THE KITCHEN?   Clean up any spills right away.  Avoid walking on wet floors.  Place frequently used items in easy-to-reach places.  If you need to reach for something  above you, use a sturdy step stool that has a grab bar.  Keep electrical cables out of the way.  Do not use floor polish or wax that makes floors slippery. If you have to use wax, make sure that it is non-skid floor wax.  Remove throw rugs and other tripping hazards from the floor. WHAT CAN I DO IN THE STAIRWAYS?  Do not leave any items on the stairs.  Make sure that there are handrails on both sides of the stairs. Fix handrails that are broken or loose. Make sure that handrails are as long as the stairways.  Check any carpeting to make sure that it is firmly attached to the stairs. Fix any carpet that is loose or worn.  Avoid having throw rugs at the top or bottom of stairways, or secure the rugs with carpet tape to prevent them from moving.  Make sure that you have a light switch at the top of the stairs and the bottom of the stairs. If you do not have them, have them installed. WHAT ARE SOME OTHER FALL PREVENTION TIPS?  Wear closed-toe shoes that fit well and support your feet. Wear shoes that have rubber soles or low heels.  When you use a stepladder, make sure that it is completely opened and that the sides are firmly locked. Have someone hold the ladder while you   are using it. Do not climb a closed stepladder.  Add color or contrast paint or tape to grab bars and handrails in your home. Place contrasting color strips on the first and last steps.  Use mobility aids as needed, such as canes, walkers, scooters, and crutches.  Turn on lights if it is dark. Replace any light bulbs that burn out.  Set up furniture so that there are clear paths. Keep the furniture in the same spot.  Fix any uneven floor surfaces.  Choose a carpet design that does not hide the edge of steps of a stairway.  Be aware of any and all pets.  Review your medicines with your healthcare provider. Some medicines can cause dizziness or changes in blood pressure, which increase your risk of falling. Talk  with your health care provider about other ways that you can decrease your risk of falls. This may include working with a physical therapist or trainer to improve your strength, balance, and endurance.   This information is not intended to replace advice given to you by your health care provider. Make sure you discuss any questions you have with your health care provider.   Document Released: 07/30/2002 Document Revised: 12/24/2014 Document Reviewed: 09/13/2014 Elsevier Interactive Patient Education 2016 [employer]  

## 2015-07-03 NOTE — Consult Note (Signed)
Consultation Note Date: 07/03/2015   Patient Name: Stephanie Colon  DOB: 11/05/1918  MRN: [id number]  Age / Sex: 81 y.o., female   PCP: Kirt BoysMonica Carter, DO Referring Physician: Alison MurrayAlma M Devine, MD  Reason for Consultation: Establishing goals of care and Psychosocial/spiritual support  Palliative Care Assessment and Plan Summary of Established Goals of Care and Medical Treatment Preferences   Clinical Assessment/Narrative: Stephanie Colon is resting quietly in bed.  She needs to use the potty chair so her niece and I talk in the hallway.  Her caregiver, Jan (retired SW, now with [employer] company) arrives and we re enter her room.  Stephanie Colon agrees to have me talk with her niece.  We talk about her current health issues, and the chronic illness trajectory for dementia.  We talk about decreased mobility and falls, also changes in appetite and thirst.   Darel HongJudy tells me that they have tried to encourage her to seek ALF in the past, and last night Stephanie Colon stated that she "needs to be somewhere they can take care of me".  Darel HongJudy tells me that she said the same this morning.  We talk about options; CM arrives and suggests they try PACE program and also another adult day care.  Darel HongJudy tells me about her struggles with her mother and placement from independent living [facility]arel Hong(Judy provided for her ADL's) until she moved to SNF. I encourage her to work with [facility] to assist with choices.  Darel HongJudy tells me about declines she has seen over the last few months, changes in her skin and frailty.  They tells me that Stephanie Colon looks more comfortable here than she has at home recently.  I share the [employer] web site and "[facility] playbook" for tips.   We talk about the concepts of do not re hospitalize, do not treat the next infection. MOST form is shared.  They have DNR at home and she is encouraged to put this on the refrigerator.  I ask if they have any more questions and Darel HongJudy tells me that she is overwhelmed.  I encourage her  that she has done an excellent job as caregiver and that the [facility] will help her with future needs.    Contacts/Participants in Discussion: Primary Decision Maker: Stephanie Colon is unable to make her own decisions at this time.  HCPOA: yes  Niece Chales AbrahamsJudy Jones is both HCPOA and durable POA.   Code Status/Advance Care Planning:  DNR  Tube feeding: NO  IV fluids and antibiotics:  Yes at this time  We discussed the concepts of Do not re hospitalize, do not treat the next infection.   Symptom Management:   Tylenol 500 mg TID  Tramadol 50 mg PO Q 6 hours PRN  Zofran 4 mg PO/IV Q 6 hours PRN  Palliative Prophylaxis: Senna S QHS PRN  Psycho-social/Spiritual:   Support System: Lives alone, Niece Chales AbrahamsJudy Jones is main caregiver,  Private pay with [employer]" 3 days per week for 3 hours.  House cleaner Q 2 weeks, yard work hired out.   Desire for further Chaplaincy support:Not discussed today.   Prognosis: Unable to determine, likely 6 months or less, d/t frailty  Discharge Planning:  Home with Home Health, Stephanie Colon and family are seeking placement options at this time.        Chief Complaint:   Fall/back pain/hip pain History of Present Illness:   Stephanie Colon is a 81 y.o. female with history of  Alzheimer's dementia, hypertension, hyperlipidemia, depression, anxiety, who lives alone and presents to the ED after a fall. Patient and niece give most of the history. Patient stated that she did not feel well on the morning of admission she got up to go to the bathroom and when coming back she fell in the kitchen. Patient states she was able to get up and go to her room where she pushed her life alert button and was subsequently brought to the ED. Patient denied any fevers, no syncopal episode, no chills, no nausea, no vomiting, no chest pain, no shortness of breath, no abdominal pain, no diarrhea, no constipation, no dysuria, no melena, no hematemesis, no hematochezia.  Patient was  seen in ED and xrays obtained of pelvis and hips were negative for fracture. CXR negative for acute infiltrate. CK was elevated at 970. Point-of-care troponin was slightly elevated at 0.09. Compressive metabolic profile had a BUN of 21 creatinine of 1.36, AST was 53 ,otherwise was within normal limits. CBC had a white count of 10.7 otherwise was within normal limits. TSH was 0.856. Urinalysis was nitrite negative leukocytes negative. Plain films of the L-spine was suspicious for mild L1 superior endplate compression fracture. While in the ED patient had some complaints of back pain and bilateral hip pain.  [facility] hospitalists were called to admit the patient for further evaluation and management as patient had an elevated troponin and complaints of back pain.  Primary Diagnoses  Present on Admission:  . Elevated troponin . Essential hypertension . Dementia . Fall . Compression fracture of L1 lumbar vertebra (HCC): Probable mild . Elevated CK . CKD (chronic kidney disease) stage 3, GFR 30-59 ml/min . Anxiety and depression . Hypercalcemia . Anemia of chronic disease  Palliative Review of Systems: [name] denies uncontrolled pain, anxiety, dyspnea or NV.  I have reviewed the medical record, interviewed the patient and family, and examined the patient. The following aspects are pertinent.  History reviewed. No pertinent past medical history. Social History   Social History  . Marital Status: Widowed    Spouse Name: N/A  . Number of Children: N/A  . Years of Education: N/A   Social History Main Topics  . Smoking status: Former Smoker -- 1.00 packs/day for 50 years  . Smokeless tobacco: None     Comment: Quit about age 74, Widowed, lives alone in her own home-drives and indep ADLs. Supportive neice Chales Abrahams who assist pt as needed  . Alcohol Use: No  . Drug Use: No  . Sexual Activity: Not Asked   Other Topics Concern  . None   Social History Narrative   Diet:Regular   Do you  drink/eat things with caffeine? No   Marital status:  Widowed                             What year were you married? 1952   Do you live in a house, apartment, assisted living, condo, trailer, etc)? House   Is it one or more stories? 1   How many persons live in your home?1   Do you have any pets in your home? No   Current or past profession: Manufacturing engineer [employer]   Do you exercise?  No  Type & how often:   Do you have a living will? Yes   Do you have a DNR Form? Yes   Do you have a POA/HPOA forms? Yes      Family History  Problem Relation Age of Onset  . Hypertension Other     other relative  . Stroke Other     other relative  . Diabetes Other     other relative  . Cancer Mother 55    Ovarian  . Heart disease Brother   . Friedreich's ataxia Brother    Scheduled Meds: . acetaminophen  500 mg Oral TID  . enoxaparin (LOVENOX) injection  30 mg Subcutaneous Q24H  . feeding supplement (ENSURE ENLIVE)  237 mL Oral BID BM  . losartan  25 mg Oral Daily  . multivitamin with minerals  1 tablet Oral Daily  . sodium chloride  3 mL Intravenous Q12H  . vitamin B-12  1,000 mcg Oral Daily   Continuous Infusions: . sodium chloride 75 mL/hr at 07/03/15 0745   PRN Meds:.albuterol, alum & mag hydroxide-simeth, ibuprofen, morphine injection, ondansetron **OR** ondansetron (ZOFRAN) IV, senna-docusate, sorbitol, traMADol Medications Prior to Admission:  Prior to Admission medications   Medication Sig Start Date End Date Taking? Authorizing Provider  losartan (COZAAR) 25 MG tablet Take 1 tablet (25 mg total) by mouth daily. 05/30/15  Yes Newt Lukes, MD  Multiple Vitamins-Minerals (MULTIVITAMIN WITH MINERALS) tablet Take 1 tablet by mouth daily.   Yes Historical Provider, MD  POTASSIUM PO Take 1 tablet by mouth daily.    Yes Historical Provider, MD  vitamin B-12 (CYANOCOBALAMIN) 1000 MCG tablet Take 1 tablet (1,000 mcg total) by mouth daily.  05/29/15  Yes Kirt Boys, DO  acetaminophen (TYLENOL) 500 MG tablet Take 1 tablet (500 mg total) by mouth 3 (three) times daily. 07/03/15   Alison Murray, MD  feeding supplement, ENSURE ENLIVE, (ENSURE ENLIVE) LIQD Take 237 mLs by mouth 2 (two) times daily between meals. 07/03/15   Alison Murray, MD  senna-docusate (SENOKOT-S) 8.6-50 MG tablet Take 1 tablet by mouth at bedtime as needed for mild constipation. 07/03/15   Alison Murray, MD  traMADol (ULTRAM) 50 MG tablet Take 1 tablet (50 mg total) by mouth every 6 (six) hours as needed for moderate pain. 07/03/15   Alison Murray, MD  venlafaxine XR (EFFEXOR XR) 37.5 MG 24 hr capsule Take 1 capsule (37.5 mg total) by mouth daily with breakfast. Patient not taking: Reported on 07/01/2015 05/28/15   Kirt Boys, DO   Allergies  Allergen Reactions  . Aspirin     REACTION: weak blood vessels  . Citalopram Hydrobromide   . Fosinopril Sodium     REACTION: unknown   CBC:    Component Value Date/Time   WBC 8.0 07/02/2015 0200   WBC 8.5 05/28/2015 1212   HGB 11.9* 07/02/2015 0200   HCT 35.6* 07/02/2015 0200   HCT 37.3 05/28/2015 1212   PLT 258 07/02/2015 0200   MCV 94.7 07/02/2015 0200   NEUTROABS 8.9* 07/01/2015 1101   NEUTROABS 6.0 05/28/2015 1212   LYMPHSABS 0.9 07/01/2015 1101   LYMPHSABS 1.9 05/28/2015 1212   MONOABS 0.8 07/01/2015 1101   EOSABS 0.0 07/01/2015 1101   BASOSABS 0.0 07/01/2015 1101   BASOSABS 0.0 05/28/2015 1212   Comprehensive Metabolic Panel:    Component Value Date/Time   NA 137 07/02/2015 0200   NA 141 05/28/2015 1212   K 4.5 07/02/2015 0200   CL 108 07/02/2015 0200  CO2 22 07/02/2015 0200   BUN 23* 07/02/2015 0200   BUN 25 05/28/2015 1212   CREATININE 1.26* 07/02/2015 0200   GLUCOSE 98 07/02/2015 0200   GLUCOSE 93 05/28/2015 1212   CALCIUM 9.4 07/02/2015 0200   AST 53* 07/01/2015 1101   ALT 27 07/01/2015 1101   ALKPHOS 85 07/01/2015 1101   BILITOT 1.1 07/01/2015 1101   BILITOT 0.4 05/28/2015 1212     PROT 8.1 07/01/2015 1101   PROT 7.6 05/28/2015 1212   ALBUMIN 4.3 07/01/2015 1101   ALBUMIN 4.3 05/28/2015 1212    Physical Exam: Vital Signs: BP 165/78 mmHg  Pulse 71  Temp(Src) 98.6 F (37 C) (Oral)  Resp 18  Ht  (1.702 m)  Wt 56.3 kg (124 lb 1.9 oz)  BMI 19.44 kg/m2  SpO2 97% SpO2: SpO2: 97 % O2 Device: O2 Device: Not Delivered O2 Flow Rate:   Intake/output summary:  Intake/Output Summary (Last 24 hours) at 07/03/15 1021 Last data filed at 07/03/15 1010  Gross per 24 hour  Intake    360 ml  Output   1195 ml  Net   -835 ml   LBM: Last BM Date: 07/02/15 Baseline Weight: Weight: 58.2 kg (128 lb 4.9 oz) Most recent weight: Weight: 56.3 kg (124 lb 1.9 oz)  Exam Findings:  Constitutional:  Elderly, frail, lying in bed,  Makes and keeps eye contact, but very HOH. (declines hearing aids).  Resp:  Even and non labored.  Cardio:  RRR, no edema Skin: thin, no bruising noted to exposed skin.  Psych: calm cooperative.           Palliative Performance Scale: 40% at best.               Additional Data Reviewed: Recent Labs     07/01/15  1101  07/02/15  0200  WBC  10.7*  8.0  HGB  14.3  11.9*  PLT  285  258  NA  137  137  BUN  21*  23*  CREATININE  1.36*  1.26*     Time In:  0950 Time Out: 1125 Time Total:   95 minutes Greater than 50%  of this time was spent counseling and coordinating care related to the above assessment and plan.  Signed by: Katheran Awe, NP  Katheran Awe, NP  07/03/2015, 10:21 AM  Please contact Palliative Medicine Team phone at [phone] for questions and concerns.

## 2015-07-03 NOTE — Progress Notes (Signed)
Went over all discharge information with patient and family.  All questions answered.  AVS summary and prescription given to patient family.  VSS.  Pt wheeled out by NT.

## 2015-07-05 DIAGNOSIS — S32010D Wedge compression fracture of first lumbar vertebra, subsequent encounter for fracture with routine healing: Secondary | ICD-10-CM | POA: Diagnosis not present

## 2015-07-05 DIAGNOSIS — I129 Hypertensive chronic kidney disease with stage 1 through stage 4 chronic kidney disease, or unspecified chronic kidney disease: Secondary | ICD-10-CM

## 2015-07-05 DIAGNOSIS — G309 Alzheimer's disease, unspecified: Secondary | ICD-10-CM

## 2015-07-05 DIAGNOSIS — N183 Chronic kidney disease, stage 3 (moderate): Secondary | ICD-10-CM

## 2015-07-10 ENCOUNTER — Ambulatory Visit: Payer: Medicare Other | Admitting: Nurse Practitioner

## 2015-07-15 ENCOUNTER — Encounter: Payer: Self-pay | Admitting: Nurse Practitioner

## 2015-07-15 ENCOUNTER — Ambulatory Visit (INDEPENDENT_AMBULATORY_CARE_PROVIDER_SITE_OTHER): Payer: Medicare Other | Admitting: Nurse Practitioner

## 2015-07-15 VITALS — BP 120/80 | HR 71 | Temp 97.7°F | Resp 20 | Ht 67.0 in | Wt 125.6 lb

## 2015-07-15 DIAGNOSIS — F0390 Unspecified dementia without behavioral disturbance: Secondary | ICD-10-CM

## 2015-07-15 DIAGNOSIS — I10 Essential (primary) hypertension: Secondary | ICD-10-CM

## 2015-07-15 DIAGNOSIS — S32010S Wedge compression fracture of first lumbar vertebra, sequela: Secondary | ICD-10-CM | POA: Diagnosis not present

## 2015-07-15 DIAGNOSIS — N183 Chronic kidney disease, stage 3 unspecified: Secondary | ICD-10-CM

## 2015-07-15 MED ORDER — ACETAMINOPHEN 500 MG PO TABS: 500.0000 mg | ORAL_TABLET | Freq: Four times a day (QID) | ORAL | Status: AC | PRN

## 2015-07-15 NOTE — Progress Notes (Signed)
Patient ID: Stephanie Colon, female   DOB: [birth year], 81 y.o.   MRN: [id number]    PCP: Kirt Boysarter, Monica, DO  Advanced Directive information Does patient have an advance directive?: Yes  Allergies  Allergen Reactions  . Aspirin     REACTION: weak blood vessels  . Citalopram Hydrobromide   . Fosinopril Sodium     REACTION: unknown    Chief Complaint  Patient presents with  . Hospitalization Follow-up    [facility] follow[facility]up  . OTHER    Has FL2 to be filled out and signed     HPI: Patient is a 81 y.o. female seen in the office today for hospital follow up. Pt with past medical history of Alzheimer's dementia, hypertension, hyperlipidemia, depression, anxiety who presented to hospital after fall at home. Imaging studies of pelvis and hips were negative for fracture. Plain films of the L-spine was suspicious for mild L1 superior endplate compression fracture and elevated troponin noted. Elevated troponin thought to be from demand ischemia and improved. Pt was evaluated by physical therapy in the hospital and due to financial reasons was discharged home with home health. Lives alone. No additional falls. Pt has not done well for a long time at home. Someone is staying with her from 9a-1p 5 days a week otherwise she is staying in bed.  Niece with pt had visit today. Plans to move her into an assistive living facility. Needs FL2 form completed today.   Review of Systems:  Review of Systems  Constitutional: Negative for activity change, appetite change and fatigue.  HENT: Positive for hearing loss. Negative for congestion.   Eyes: Negative.   Respiratory: Negative for cough and shortness of breath.   Cardiovascular: Negative for chest pain, palpitations and leg swelling.  Gastrointestinal: Negative for abdominal pain, diarrhea and constipation.  Genitourinary: Negative for dysuria and difficulty urinating.  Musculoskeletal: Positive for gait problem (recent fall). Negative for myalgias  and arthralgias.  Skin: Positive for color change (brusing and sensitive skin ). Negative for wound.  Neurological: Positive for weakness. Negative for dizziness.  Psychiatric/Behavioral: Positive for confusion. Negative for behavioral problems and agitation.       Worsening dementia     History reviewed. No pertinent past medical history. Past Surgical History  Procedure Laterality Date  . Cholecystectomy    . Internal surgery fron chron  1999  . Ovarian cyst removal    . Abdominal hysterectomy    . Appendectomy     Social History:   reports that she has quit smoking. She does not have any smokeless tobacco history on file. She reports that she does not drink alcohol or use illicit drugs.  Family History  Problem Relation Age of Onset  . Hypertension Other     other relative  . Stroke Other     other relative  . Diabetes Other     other relative  . Cancer Mother [id number]    Ovarian  . Heart disease Brother   . Friedreich's ataxia Brother     Medications: Patient's Medications  New Prescriptions   No medications on file  Previous Medications   FEEDING SUPPLEMENT, ENSURE ENLIVE, (ENSURE ENLIVE) LIQD    Take 237 mLs by mouth 2 (two) times daily between meals.   LOSARTAN (COZAAR) 25 MG TABLET    Take 1 tablet (25 mg total) by mouth daily.   MULTIPLE VITAMINS-MINERALS (MULTIVITAMIN WITH MINERALS) TABLET    Take 1 tablet by mouth daily.   VITAMIN B-12 (CYANOCOBALAMIN)  1000 MCG TABLET    Take 1 tablet (1,000 mcg total) by mouth daily.  Modified Medications   Modified Medication Previous Medication   ACETAMINOPHEN (TYLENOL) 500 MG TABLET acetaminophen (TYLENOL) 500 MG tablet      Take 1 tablet (500 mg total) by mouth every 6 (six) hours as needed.    Take 1 tablet (500 mg total) by mouth 3 (three) times daily.  Discontinued Medications   POTASSIUM PO    Take 1 tablet by mouth daily.    SENNA-DOCUSATE (SENOKOT-S) 8.6-50 MG TABLET    Take 1 tablet by mouth at bedtime as needed for  mild constipation.   TRAMADOL (ULTRAM) 50 MG TABLET    Take 1 tablet (50 mg total) by mouth every 6 (six) hours as needed for moderate pain.     Physical Exam:  Filed Vitals:   07/15/15 1410  BP: 120/80  Pulse: 71  Temp: 97.7 F (36.5 C)  TempSrc: Oral  Resp: 20  Height:  (1.702 m)  Weight: 125 lb 9.6 oz (56.972 kg)  SpO2: 97%   Body mass index is 19.67 kg/(m^2).  Physical Exam  Constitutional: She appears well-developed. No distress.  Frail appearing female in NAD  HENT:  Mouth/Throat: Oropharynx is clear and moist. No oropharyngeal exudate.  HOH; poor dentition  Eyes: No scleral icterus. Right pupil not reactive: pinpoint.  Neck: Neck supple. Carotid bruit is not present. No tracheal deviation present.  Cardiovascular: Normal rate, regular rhythm and intact distal pulses.  Exam reveals no gallop and no friction rub.   Murmur heard.    Pulmonary/Chest: Effort normal and breath sounds normal.  Abdominal: Soft. Bowel sounds are normal. There is no hepatomegaly.  Musculoskeletal: She exhibits tenderness (bilateral LE). She exhibits no edema.  Neurological: She is alert.  Skin: Skin is warm and dry. No rash noted.  Psychiatric: She has a normal mood and affect. Her behavior is normal.    Labs reviewed: Basic Metabolic Panel:  Recent Labs  24/40/10 1212 07/01/15 1101 07/01/15 1430 07/01/15 1649 07/02/15 0200  NA 141 137  --   --  137  K 4.8 4.9  --   --  4.5  CL 101 104  --   --  108  CO2 22 24  --   --  22  GLUCOSE 93 133*  --   --  98  BUN 25 21*  --   --  23*  CREATININE 1.37* 1.36*  --   --  1.26*  CALCIUM 10.8* 10.6*  --   --  9.4  MG  --   --   --  2.0  --   TSH 2.790  --  0.856  --   --    Liver Function Tests:  Recent Labs  05/28/15 1212 07/01/15 1101  AST 21 53*  ALT 14 27  ALKPHOS 77 85  BILITOT 0.4 1.1  PROT 7.6 8.1  ALBUMIN 4.3 4.3   No results for input(s): LIPASE, AMYLASE in the last 8760 hours. No results for input(s): AMMONIA  in the last 8760 hours. CBC:  Recent Labs  05/28/15 1212 07/01/15 1101 07/02/15 0200  WBC 8.5 10.7* 8.0  NEUTROABS 6.0 8.9*  --   HGB  --  14.3 11.9*  HCT 37.3 42.3 35.6*  MCV  --  94.0 94.7  PLT  --  285 258   Lipid Panel:  Recent Labs  05/28/15 1212  CHOL 203*  HDL 44  LDLCALC 87  TRIG  359*  CHOLHDL 4.6*   TSH:  Recent Labs  05/28/15 1212 07/01/15 1430  TSH 2.790 0.856   A1C: Lab Results  Component Value Date   HGBA1C 6.0 08/25/2011     Assessment/Plan 1. Essential hypertension Blood pressure stable, conts on losartan 25 mg daily   2. Compression fracture of L1 lumbar vertebra, sequela After fall at home, Working with PT at home. No further falls noted. Pain controlled with as needed tylenol   3. Dementia, without behavioral disturbance Advanced dementia, needing increase supervision and assistance. FL2 form brought in office today to be filled out for a move to [facility]  4. CKD (chronic kidney disease) stage 3, GFR 30-59 ml/min Stable, avoid NSAIDs and encouraged hydration.   FL2 completed, to get TB skin test next week for assisted living admission.  Janene Harvey. Biagio Borg  [facility] [phone] 8 am - 5 pm) [phone] (after hours)

## 2015-07-15 NOTE — Patient Instructions (Signed)
Make appt for nursing visit on Monday for TB skin test

## 2015-07-21 ENCOUNTER — Ambulatory Visit (INDEPENDENT_AMBULATORY_CARE_PROVIDER_SITE_OTHER): Payer: Medicare Other

## 2015-07-21 DIAGNOSIS — Z111 Encounter for screening for respiratory tuberculosis: Secondary | ICD-10-CM | POA: Diagnosis not present

## 2015-07-23 ENCOUNTER — Telehealth: Payer: Self-pay | Admitting: *Deleted

## 2015-07-23 NOTE — Telephone Encounter (Signed)
Patient came in for TB results (negative). Given an immunization result list for the nursing home.

## 2015-08-21 ENCOUNTER — Encounter: Payer: Self-pay | Admitting: *Deleted

## 2015-08-28 ENCOUNTER — Other Ambulatory Visit: Payer: Self-pay | Admitting: Internal Medicine

## 2016-03-25 ENCOUNTER — Ambulatory Visit: Payer: Medicare Other | Admitting: Nurse Practitioner

## 2016-04-30 ENCOUNTER — Inpatient Hospital Stay (HOSPITAL_COMMUNITY)
Admission: EM | Admit: 2016-04-30 | Discharge: 2016-05-06 | DRG: 682 | Disposition: A | Discharge status: Home Health | Payer: Medicare Other | Attending: Internal Medicine | Admitting: Internal Medicine

## 2016-04-30 ENCOUNTER — Emergency Department (HOSPITAL_COMMUNITY): Payer: Medicare Other

## 2016-04-30 ENCOUNTER — Encounter (HOSPITAL_COMMUNITY): Payer: Self-pay | Admitting: Emergency Medicine

## 2016-04-30 DIAGNOSIS — Z681 Body mass index (BMI) 19 or less, adult: Secondary | ICD-10-CM | POA: Diagnosis not present

## 2016-04-30 DIAGNOSIS — E860 Dehydration: Secondary | ICD-10-CM | POA: Diagnosis present

## 2016-04-30 DIAGNOSIS — Z87891 Personal history of nicotine dependence: Secondary | ICD-10-CM | POA: Diagnosis not present

## 2016-04-30 DIAGNOSIS — E872 Acidosis: Secondary | ICD-10-CM | POA: Diagnosis present

## 2016-04-30 DIAGNOSIS — G309 Alzheimer's disease, unspecified: Secondary | ICD-10-CM | POA: Diagnosis present

## 2016-04-30 DIAGNOSIS — Z9049 Acquired absence of other specified parts of digestive tract: Secondary | ICD-10-CM

## 2016-04-30 DIAGNOSIS — I130 Hypertensive heart and chronic kidney disease with heart failure and stage 1 through stage 4 chronic kidney disease, or unspecified chronic kidney disease: Secondary | ICD-10-CM | POA: Diagnosis present

## 2016-04-30 DIAGNOSIS — N390 Urinary tract infection, site not specified: Secondary | ICD-10-CM | POA: Diagnosis present

## 2016-04-30 DIAGNOSIS — Z79899 Other long term (current) drug therapy: Secondary | ICD-10-CM

## 2016-04-30 DIAGNOSIS — E876 Hypokalemia: Secondary | ICD-10-CM | POA: Diagnosis present

## 2016-04-30 DIAGNOSIS — N183 Chronic kidney disease, stage 3 (moderate): Secondary | ICD-10-CM | POA: Diagnosis present

## 2016-04-30 DIAGNOSIS — I4891 Unspecified atrial fibrillation: Secondary | ICD-10-CM | POA: Diagnosis present

## 2016-04-30 DIAGNOSIS — I5032 Chronic diastolic (congestive) heart failure: Secondary | ICD-10-CM | POA: Diagnosis present

## 2016-04-30 DIAGNOSIS — N179 Acute kidney failure, unspecified: Principal | ICD-10-CM | POA: Diagnosis present

## 2016-04-30 DIAGNOSIS — E785 Hyperlipidemia, unspecified: Secondary | ICD-10-CM | POA: Diagnosis present

## 2016-04-30 DIAGNOSIS — I10 Essential (primary) hypertension: Secondary | ICD-10-CM | POA: Diagnosis present

## 2016-04-30 DIAGNOSIS — F0280 Dementia in other diseases classified elsewhere without behavioral disturbance: Secondary | ICD-10-CM | POA: Diagnosis present

## 2016-04-30 DIAGNOSIS — E43 Unspecified severe protein-calorie malnutrition: Secondary | ICD-10-CM | POA: Diagnosis present

## 2016-04-30 DIAGNOSIS — Z66 Do not resuscitate: Secondary | ICD-10-CM | POA: Diagnosis present

## 2016-04-30 DIAGNOSIS — D649 Anemia, unspecified: Secondary | ICD-10-CM | POA: Diagnosis present

## 2016-04-30 DIAGNOSIS — A047 Enterocolitis due to Clostridium difficile: Secondary | ICD-10-CM | POA: Diagnosis not present

## 2016-04-30 DIAGNOSIS — I480 Paroxysmal atrial fibrillation: Secondary | ICD-10-CM | POA: Diagnosis not present

## 2016-04-30 DIAGNOSIS — R5383 Other fatigue: Secondary | ICD-10-CM | POA: Diagnosis present

## 2016-04-30 DIAGNOSIS — I4581 Long QT syndrome: Secondary | ICD-10-CM | POA: Diagnosis present

## 2016-04-30 HISTORY — DX: Essential (primary) hypertension: I10

## 2016-04-30 HISTORY — DX: Alzheimer's disease, unspecified: G30.9

## 2016-04-30 HISTORY — DX: Disorder of kidney and ureter, unspecified: N28.9

## 2016-04-30 HISTORY — DX: Dementia in other diseases classified elsewhere, unspecified severity, without behavioral disturbance, psychotic disturbance, mood disturbance, and anxiety: F02.80

## 2016-04-30 HISTORY — DX: Anemia, unspecified: D64.9

## 2016-04-30 LAB — CBG MONITORING, ED: Glucose-Capillary: 138 mg/dL — ABNORMAL HIGH (ref 65–99)

## 2016-04-30 LAB — URINE MICROSCOPIC-ADD ON: RBC / HPF: NONE SEEN RBC/hpf (ref 0–5)

## 2016-04-30 LAB — URINALYSIS, ROUTINE W REFLEX MICROSCOPIC
BILIRUBIN URINE: NEGATIVE
GLUCOSE, UA: NEGATIVE mg/dL
Hgb urine dipstick: NEGATIVE
KETONES UR: NEGATIVE mg/dL
NITRITE: NEGATIVE
PH: 6 (ref 5.0–8.0)
Protein, ur: NEGATIVE mg/dL
Specific Gravity, Urine: 1.014 (ref 1.005–1.030)

## 2016-04-30 LAB — BASIC METABOLIC PANEL
Anion gap: 12 (ref 5–15)
BUN: 31 mg/dL — AB (ref 6–20)
CHLORIDE: 97 mmol/L — AB (ref 101–111)
CO2: 22 mmol/L (ref 22–32)
CREATININE: 2.39 mg/dL — AB (ref 0.44–1.00)
Calcium: 8.1 mg/dL — ABNORMAL LOW (ref 8.9–10.3)
GFR calc Af Amer: 19 mL/min — ABNORMAL LOW (ref >60)
GFR calc non Af Amer: 16 mL/min — ABNORMAL LOW (ref >60)
Glucose, Bld: 129 mg/dL — ABNORMAL HIGH (ref 65–99)
Potassium: 3.1 mmol/L — ABNORMAL LOW (ref 3.5–5.1)
Sodium: 131 mmol/L — ABNORMAL LOW (ref 135–145)

## 2016-04-30 LAB — CBC WITH DIFFERENTIAL/PLATELET
Basophils Absolute: 0 10*3/uL (ref 0.0–0.1)
Basophils Relative: 0 %
Eosinophils Absolute: 0.1 10*3/uL (ref 0.0–0.7)
Eosinophils Relative: 1 %
HEMATOCRIT: 31 % — AB (ref 36.0–46.0)
Hemoglobin: 10.1 g/dL — ABNORMAL LOW (ref 12.0–15.0)
LYMPHS ABS: 1.1 10*3/uL (ref 0.7–4.0)
LYMPHS PCT: 14 %
MCH: 28.2 pg (ref 26.0–34.0)
MCHC: 32.6 g/dL (ref 30.0–36.0)
MCV: 86.6 fL (ref 78.0–100.0)
MONO ABS: 0.5 10*3/uL (ref 0.1–1.0)
MONOS PCT: 7 %
NEUTROS ABS: 5.9 10*3/uL (ref 1.7–7.7)
Neutrophils Relative %: 78 %
Platelets: 564 10*3/uL — ABNORMAL HIGH (ref 150–400)
RBC: 3.58 MIL/uL — ABNORMAL LOW (ref 3.87–5.11)
RDW: 13.8 % (ref 11.5–15.5)
WBC: 7.6 10*3/uL (ref 4.0–10.5)

## 2016-04-30 LAB — I-STAT CG4 LACTIC ACID, ED
LACTIC ACID, VENOUS: 2.14 mmol/L — AB (ref 0.5–1.9)
Lactic Acid, Venous: 3.36 mmol/L (ref 0.5–1.9)

## 2016-04-30 LAB — I-STAT TROPONIN, ED: Troponin i, poc: 0.05 ng/mL (ref 0.00–0.08)

## 2016-04-30 LAB — CK: Total CK: 104 U/L (ref 38–234)

## 2016-04-30 MED ORDER — VANCOMYCIN HCL IN DEXTROSE 1-5 GM/200ML-% IV SOLN
1000.0000 mg | Freq: Once | INTRAVENOUS | Status: AC
  Administered 2016-04-30: 1000 mg via INTRAVENOUS
  Filled 2016-04-30: qty 200

## 2016-04-30 MED ORDER — SODIUM CHLORIDE 0.9 % IV BOLUS (SEPSIS)
250.0000 mL | Freq: Once | INTRAVENOUS | Status: AC
  Administered 2016-04-30: 250 mL via INTRAVENOUS

## 2016-04-30 MED ORDER — SODIUM CHLORIDE 0.9 % IV BOLUS (SEPSIS)
1000.0000 mL | Freq: Once | INTRAVENOUS | Status: AC
  Administered 2016-04-30: 1000 mL via INTRAVENOUS

## 2016-04-30 MED ORDER — PIPERACILLIN-TAZOBACTAM 3.375 G IVPB 30 MIN
3.3750 g | Freq: Once | INTRAVENOUS | Status: AC
  Administered 2016-04-30: 3.375 g via INTRAVENOUS
  Filled 2016-04-30: qty 50

## 2016-04-30 MED ORDER — SODIUM CHLORIDE 0.9 % IV BOLUS (SEPSIS)
500.0000 mL | Freq: Once | INTRAVENOUS | Status: AC
  Administered 2016-04-30: 500 mL via INTRAVENOUS

## 2016-04-30 NOTE — ED Notes (Signed)
Admitting MD at bedside.

## 2016-04-30 NOTE — ED Notes (Signed)
Nurse drawing labs. 

## 2016-04-30 NOTE — H&P (Signed)
Stephanie Colon BJY:[id number] DOB: [birth year] DOA: 04/30/2016     PCP: Kirt Boys, DO   Outpatient Specialists: Gerald Leitz  Patient coming from:  From facility [facility]  Chief Complaint: Generalized fatigue  HPI: Stephanie Colon is a 82 y.o. female with medical history significant of  Alzheimer's dementia, hypertension, hyperlipidemia, depression, anxiety,  L1 superior endplate compression chronic diastolic CHF    Presented with overall generalized fatigue for the past few days. 5 days ago patient was diagnosed with UTI and started on Bactrim but she's been getting progressively weak and hasn't been eating well and refusing to get out of bed  She's been gradually getting weaker patient has not endorsed any fevers but per records may have been febrile at the facility she's been confused more then usual.  Regarding pertinent Chronic problems: Patient has significant history of dementia her records in the past she required to be on hospice even end-stage dimension His history of hypertension maintain on Cozaar. Patient has diastolic CHF as documented. Echogram in 2016 showing grade 1 diastolic dysfunction and preserved EF  IN ER:  Temp (24hrs), Avg:97.8 F (36.6 C), Min:97.8 F (36.6 C), Max:97.8 F (36.6 C)   Respirations up to 22 7000% on room air heart rate 84 blood pressure 118/55 To 137/48 lactic acid initially 3.36 after fluid administration down to 2.14  Troponin 0.05 WBC 7.6 hemoglobin 10.1 which is close to her baseline Sodium 131 potassium 3.1 gr 2.39 which is up from baseline 1.26  Chest x-ray showing left base scarring although could not rule out infectious process Following Medications were ordered in ER: Medications  sodium chloride 0.9 % bolus 1,000 mL (0 mLs Intravenous Stopped 04/30/16 1601)    And  sodium chloride 0.9 % bolus 500 mL (0 mLs Intravenous Stopped 04/30/16 1655)    And  sodium chloride 0.9 % bolus 250 mL (0 mLs Intravenous Stopped 04/30/16 1637)    piperacillin-tazobactam (ZOSYN) IVPB 3.375 g (0 g Intravenous Stopped 04/30/16 1612)  vancomycin (VANCOCIN) IVPB 1000 mg/200 mL premix (0 mg Intravenous Stopped 04/30/16 1642)      Hospitalist was called for admission for Dehydration and acute on chronic renal failure lactic acidosis  Review of Systems:    Pertinent positives include:  Fevers, chills, fatigue,  Constitutional:  No weight loss, night sweats, weight loss  HEENT:  No headaches, Difficulty swallowing,Tooth/dental problems,Sore throat,  No sneezing, itching, ear ache, nasal congestion, post nasal drip,  Cardio-vascular:  No chest pain, Orthopnea, PND, anasarca, dizziness, palpitations.no Bilateral lower extremity swelling  GI:  No heartburn, indigestion, abdominal pain, nausea, vomiting, diarrhea, change in bowel habits, loss of appetite, melena, blood in stool, hematemesis Resp:  no shortness of breath at rest. No dyspnea on exertion, No excess mucus, no productive cough, No non-productive cough, No coughing up of blood.No change in color of mucus.No wheezing. Skin:  no rash or lesions. No jaundice GU:  no dysuria, change in color of urine, no urgency or frequency. No straining to urinate.  No flank pain.  Musculoskeletal:  No joint pain or no joint swelling. No decreased range of motion. No back pain.  Psych:  No change in mood or affect. No depression or anxiety. No memory loss.  Neuro: no localizing neurological complaints, no tingling, no weakness, no double vision, no gait abnormality, no slurred speech, no confusion  As per HPI otherwise 10 point review of systems negative.   Past Medical History: Past Medical History:  Diagnosis Date  .  Alzheimer disease   . Anemia   . Hypertension   . Renal disorder    Past Surgical History:  Procedure Laterality Date  . ABDOMINAL HYSTERECTOMY    . APPENDECTOMY    . CHOLECYSTECTOMY    . Internal surgery fron chron  1999  . OVARIAN CYST REMOVAL       Social  History:  Ambulatory   walker       reports that she has quit smoking. She has a 50.00 pack-year smoking history. She has never used smokeless tobacco. She reports that she does not drink alcohol or use drugs.  Allergies:   Allergies  Allergen Reactions  . Aspirin Other (See Comments)    REACTION: weak blood vessels-per MD   . Citalopram Hydrobromide Other (See Comments)  . Fosinopril Sodium Other (See Comments)    REACTION: unknown       Family History:   Family History  Problem Relation Age of Onset  . Cancer Mother 75    Ovarian  . Heart disease Brother   . Friedreich's ataxia Brother   . Hypertension Other     other relative  . Stroke Other     other relative  . Diabetes Other     other relative    Medications: Prior to Admission medications   Medication Sig Start Date End Date Taking? Authorizing Provider  alum & mag hydroxide-simeth (MAALOX PLUS) 400-400-40 MG/5ML suspension Take 30 mLs by mouth every 6 (six) hours as needed for indigestion.   Yes Historical Provider, MD  guaifenesin (ROBITUSSIN) 100 MG/5ML syrup Take 200 mg by mouth 3 (three) times daily as needed for cough.   Yes Historical Provider, MD  loperamide (IMODIUM) 2 MG capsule Take 2 mg by mouth as needed for diarrhea or loose stools.   Yes Historical Provider, MD  losartan (COZAAR) 25 MG tablet Take 1 tablet (25 mg total) by mouth daily. 05/30/15  Yes Newt Lukes, MD  magnesium hydroxide (MILK OF MAGNESIA) 400 MG/5ML suspension Take 30 mLs by mouth daily as needed for mild constipation.   Yes Historical Provider, MD  neomycin-bacitracin-polymyxin (NEOSPORIN) ointment Apply 1 application topically as needed for wound care. apply to eye   Yes Historical Provider, MD  Nutritional Supplements (EQ NUTRITIONAL SHAKE) LIQD Take 118 mLs by mouth 3 (three) times daily.   Yes Historical Provider, MD  sulfamethoxazole-trimethoprim (BACTRIM DS,SEPTRA DS) 800-160 MG tablet Take 1 tablet by mouth 2 (two) times  daily. Started 04/27/16, for 7 days ending 05/03/16   Yes Historical Provider, MD  traMADol (ULTRAM) 50 MG tablet Take 50 mg by mouth every 6 (six) hours as needed for moderate pain.   Yes Historical Provider, MD  acetaminophen (TYLENOL) 500 MG tablet Take 1 tablet (500 mg total) by mouth every 6 (six) hours as needed. Patient taking differently: Take 500 mg by mouth every 6 (six) hours as needed for moderate pain or fever.  07/15/15   Sharon Seller, NP  feeding supplement, ENSURE ENLIVE, (ENSURE ENLIVE) LIQD Take 237 mLs by mouth 2 (two) times daily between meals. Patient not taking: Reported on 04/30/2016 07/03/15   Alison Murray, MD  vitamin B-12 (CYANOCOBALAMIN) 1000 MCG tablet Take 1 tablet (1,000 mcg total) by mouth daily. Patient not taking: Reported on 04/30/2016 05/29/15   Kirt Boys, DO    Physical Exam: Patient Vitals for the past 24 hrs:  BP Temp Temp src Pulse Resp SpO2 Height Weight  04/30/16 2000 (!) 137/48 - - - 20 - - -  04/30/16 1945 (!) 120/46 - - - 21 - - -  04/30/16 1930 113/64 - - - 16 - - -  04/30/16 1915 118/62 - - - 19 - - -  04/30/16 1830 120/92 - - - 25 - - -  04/30/16 1800 127/55 - - - 22 - - -  04/30/16 1700 (!) 117/48 - - 80 18 100 % - -  04/30/16 1659 106/61 - - 67 18 100 % - -  04/30/16 1630 120/57 - - 65 18 100 % - -  04/30/16 1446 118/55 97.8 F (36.6 C) Oral 84 20 100 % - -  04/30/16 1435 - - - - - - 5\' 7"  (1.702 m) 52.2 kg (115 lb)  04/30/16 1431 - - - - - 97 % - -    1. General:  in No Acute distress 2. Psychological: Alert but not  Oriented 3. Head/ENT:      Dry Mucous Membranes                          Head Non traumatic, neck supple                            Poor Dentition 4. SKIN: decreased Skin turgor,  Skin clean Dry and intact no rash 5. Heart: Regular rate and rhythm no  Murmur, Rub or gallop 6. Lungs:  Clear to auscultation bilaterally, no wheezes or crackles   7. Abdomen: Soft,  non-tender, Non distended 8. Lower extremities: no  clubbing, cyanosis, or edema 9. Neurologically Grossly intact, moving all 4 extremities equally  10. MSK: Normal range of motion   body mass index is 18.01 kg/m.  Labs on Admission:   Labs on Admission: I have personally reviewed following labs and imaging studies  CBC:  Recent Labs Lab 04/30/16 1449  WBC 7.6  NEUTROABS 5.9  HGB 10.1*  HCT 31.0*  MCV 86.6  PLT 564*   Basic Metabolic Panel:  Recent Labs Lab 04/30/16 1446  NA 131*  K 3.1*  CL 97*  CO2 22  GLUCOSE 129*  BUN 31*  CREATININE 2.39*  CALCIUM 8.1*   GFR: Estimated Creatinine Clearance: 11.6 mL/min (by C-G formula based on SCr of 2.39 mg/dL). Liver Function Tests: No results for input(s): AST, ALT, ALKPHOS, BILITOT, PROT, ALBUMIN in the last 168 hours. No results for input(s): LIPASE, AMYLASE in the last 168 hours. No results for input(s): AMMONIA in the last 168 hours. Coagulation Profile: No results for input(s): INR, PROTIME in the last 168 hours. Cardiac Enzymes: No results for input(s): CKTOTAL, CKMB, CKMBINDEX, TROPONINI in the last 168 hours. BNP (last 3 results) No results for input(s): PROBNP in the last 8760 hours. HbA1C: No results for input(s): HGBA1C in the last 72 hours. CBG:  Recent Labs Lab 04/30/16 1449  GLUCAP 138*   Lipid Profile: No results for input(s): CHOL, HDL, LDLCALC, TRIG, CHOLHDL, LDLDIRECT in the last 72 hours. Thyroid Function Tests: No results for input(s): TSH, T4TOTAL, FREET4, T3FREE, THYROIDAB in the last 72 hours. Anemia Panel: No results for input(s): VITAMINB12, FOLATE, FERRITIN, TIBC, IRON, RETICCTPCT in the last 72 hours. Urine analysis:    Component Value Date/Time   COLORURINE YELLOW 04/30/2016 1745   APPEARANCEUR CLEAR 04/30/2016 1745   APPEARANCEUR Clear 05/28/2015 1223   LABSPEC 1.014 04/30/2016 1745   PHURINE 6.0 04/30/2016 1745   GLUCOSEU NEGATIVE 04/30/2016 1745   HGBUR NEGATIVE 04/30/2016  1745   BILIRUBINUR NEGATIVE 04/30/2016 1745    BILIRUBINUR Negative 05/28/2015 1223   KETONESUR NEGATIVE 04/30/2016 1745   PROTEINUR NEGATIVE 04/30/2016 1745   UROBILINOGEN 0.2 07/01/2015 1147   NITRITE NEGATIVE 04/30/2016 1745   LEUKOCYTESUR SMALL (A) 04/30/2016 1745   LEUKOCYTESUR Negative 05/28/2015 1223   Sepsis Labs: @LABRCNTIP (procalcitonin:4,lacticidven:4) )No results found for this or any previous visit (from the past 240 hour(s)).    UA no evidence of UTI   Lab Results  Component Value Date   HGBA1C 6.0 08/25/2011    Estimated Creatinine Clearance: 11.6 mL/min (by C-G formula based on SCr of 2.39 mg/dL).  BNP (last 3 results) No results for input(s): PROBNP in the last 8760 hours.   ECG REPORT  Independently reviewed Rate:81  Rhythm: Irregularly irregular rhythm suspect A. fib with multiple PVCs ST&T Change: No acute ischemic changes  QTC 510  Filed Weights   04/30/16 1435  Weight: 52.2 kg (115 lb)     Cultures:    Component Value Date/Time   SDES STOOL 05/04/2009 2110   SDES STOOL 05/04/2009 2110   SPECREQUEST NONE 05/04/2009 2110   SPECREQUEST NONE 05/04/2009 2110   CULT  05/04/2009 2110    NO SALMONELLA, SHIGELLA, CAMPYLOBACTER, OR YERSINIA ISOLATED   REPTSTATUS 05/08/2009 FINAL 05/04/2009 2110   REPTSTATUS 05/05/2009 FINAL 05/04/2009 2110     Radiological Exams on Admission: Dg Chest 2 View  Result Date: 04/30/2016 CLINICAL DATA:  New onset atrial fibrillation an weakness. Duration of symptoms 3 days. EXAM: CHEST  2 VIEW COMPARISON:  07/01/2015 FINDINGS: Heart size is normal. Aortic atherosclerosis. Mediastinal shadows are otherwise normal. There is chronic scarring at the left lung base. These markings are presumed to represent chronic markings, but mild atelectasis or infiltrate at the left base is not excluded. No effusions. Arterial calcification is noted. No acute bone finding. IMPRESSION: Aortic atherosclerosis. Left base scarring. Cannot rule out some active atelectasis or infiltrate at the  left base. Electronically Signed   By: Paulina Fusi M.D.   On: 04/30/2016 15:20    Chart has been reviewed    Assessment/Plan   82 y.o. female with medical history significant of  Alzheimer's dementia, hypertension, hyperlipidemia, depression, anxiety,  L1 superior endplate compression chronic diastolic CHF being admitted for dehydration and lactic acidosis and New onset atrial fibrillation  Present on Admission: . AKI (acute kidney injury) (HCC) - Chin will rehydrate check urine electrolytes . Dehydration - secondary to poor by mouth intake . Essential hypertension - stable continue home medications . UTI (lower urinary tract infection) and has been on Septra for the past 3 days and recently was switched to Cipro UA show no evidence of UTI, patient currently does not meet sepsis criteria by but still within normal limits non-tachycardiac no fever. Unsure of current symptoms related to ongoing symptomatology from UTI for now continue Rocephin for next 24 hours and observe New-onset atrial fibrillation - patient has aspirin allergy, currently ambulates with a walker resides in an assisted living given advanced age multiple comorbidities would need to discuss with the family overall goals of care for now will start on Lovenox Order echo gram TSH Prolonged QT - - will monitor on tele avoid QT prolonging medications, rehydrate correct electrolytes  Other plan as per orders.  DVT prophylaxis:   Lovenox     Code Status:   DNR/DNI  as per  family per ER MD   Family Communication:   Family not at  Bedside    Disposition  Plan:                             Back to current facility when stable                                                   Would benefit from PT/OT eval prior to DC  ordered                      Social Work  Nutrition   consulted                          Consults called: cardiology   Admission status:  inpatient      Level of care   tele      I have spent a total of 57  min on this admission  Mychaela Lennartz 04/30/2016, 9:55 PM    [facility]  Pager [phone]   after 2 AM please page floor coverage PA If 7AM-7PM, please contact the day team taking care of the patient  [email or web address]  Password [email or web address]

## 2016-04-30 NOTE — ED Provider Notes (Signed)
[facility]-EMERGENCY DEPT Provider Note   CSN: [id number] Arrival date & time: 04/30/16  1431     History   Chief Complaint Chief Complaint  Patient presents with  . Weakness    HPI Stephanie Colon is a 82 y.o. female.  The history is provided by the patient, the nursing home and a relative.  Weakness  Primary symptoms comment: generalized weakness. This is a recurrent problem. The current episode started more than 2 days ago. The problem has been gradually worsening. There was no focality noted. There has been no fever. Associated symptoms include confusion. Pertinent negatives include no shortness of breath. Associated symptoms comments: Diagnosed with UTI. There were no medications administered prior to arrival. Associated medical issues include dementia. Associated medical issues do not include trauma, seizures or CVA.       Past Medical History:  Diagnosis Date  . Alzheimer disease   . Anemia   . Hypertension   . Renal disorder     Patient Active Problem List   Diagnosis Date Noted  . Dehydration 04/30/2016  . UTI (lower urinary tract infection) 04/30/2016  . Palliative care encounter   . DNR (do not resuscitate) discussion   . Anxiety and depression 07/02/2015  . Hypercalcemia 07/02/2015  . Anemia of chronic disease 07/02/2015  . Elevated troponin 07/01/2015  . Dementia 07/01/2015  . Fall 07/01/2015  . Compression fracture of L1 lumbar vertebra (HCC): Probable mild 07/01/2015  . Elevated CK 07/01/2015  . CKD (chronic kidney disease) stage 3, GFR 30-59 ml/min 07/01/2015  . Essential hypertension 01/21/2010    Past Surgical History:  Procedure Laterality Date  . ABDOMINAL HYSTERECTOMY    . APPENDECTOMY    . CHOLECYSTECTOMY    . Internal surgery fron chron  1999  . OVARIAN CYST REMOVAL      OB History    No data available       Home Medications    Prior to Admission medications   Medication Sig Start Date End Date Taking? Authorizing Provider  alum & mag  hydroxide-simeth (MAALOX PLUS) 400-400-40 MG/5ML suspension Take 30 mLs by mouth every 6 (six) hours as needed for indigestion.   Yes Historical Provider, MD  guaifenesin (ROBITUSSIN) 100 MG/5ML syrup Take 200 mg by mouth 3 (three) times daily as needed for cough.   Yes Historical Provider, MD  loperamide (IMODIUM) 2 MG capsule Take 2 mg by mouth as needed for diarrhea or loose stools.   Yes Historical Provider, MD  losartan (COZAAR) 25 MG tablet Take 1 tablet (25 mg total) by mouth daily. 05/30/15  Yes Newt Lukes, MD  magnesium hydroxide (MILK OF MAGNESIA) 400 MG/5ML suspension Take 30 mLs by mouth daily as needed for mild constipation.   Yes Historical Provider, MD  neomycin-bacitracin-polymyxin (NEOSPORIN) ointment Apply 1 application topically as needed for wound care. apply to eye   Yes Historical Provider, MD  Nutritional Supplements (EQ NUTRITIONAL SHAKE) LIQD Take 118 mLs by mouth 3 (three) times daily.   Yes Historical Provider, MD  sulfamethoxazole-trimethoprim (BACTRIM DS,SEPTRA DS) 800-160 MG tablet Take 1 tablet by mouth 2 (two) times daily. Started 04/27/16, for 7 days ending 05/03/16   Yes Historical Provider, MD  traMADol (ULTRAM) 50 MG tablet Take 50 mg by mouth every 6 (six) hours as needed for moderate pain.   Yes Historical Provider, MD  acetaminophen (TYLENOL) 500 MG tablet Take 1 tablet (500 mg total) by mouth every 6 (six) hours as needed. Patient taking differently: Take 500 mg  by mouth every 6 (six) hours as needed for moderate pain or fever.  07/15/15   Sharon Seller, NP  feeding supplement, ENSURE ENLIVE, (ENSURE ENLIVE) LIQD Take 237 mLs by mouth 2 (two) times daily between meals. Patient not taking: Reported on 04/30/2016 07/03/15   Alison Murray, MD  vitamin B-12 (CYANOCOBALAMIN) 1000 MCG tablet Take 1 tablet (1,000 mcg total) by mouth daily. Patient not taking: Reported on 04/30/2016 05/29/15   Kirt Boys, DO    Family History Family History  Problem Relation  Age of Onset  . Cancer Mother 73    Ovarian  . Heart disease Brother   . Friedreich's ataxia Brother   . Hypertension Other     other relative  . Stroke Other     other relative  . Diabetes Other     other relative    Social History Social History  Substance Use Topics  . Smoking status: Former Smoker    Packs/day: 1.00    Years: 50.00  . Smokeless tobacco: Never Used     Comment: Quit about age 8, Widowed, lives alone in her own home-drives and indep ADLs. Supportive neice Chales Abrahams who assist pt as needed  . Alcohol use No     Allergies   Aspirin; Citalopram hydrobromide; and Fosinopril sodium   Review of Systems Review of Systems  Unable to perform ROS: Dementia  Respiratory: Negative for shortness of breath.   Neurological: Positive for weakness.  Psychiatric/Behavioral: Positive for confusion.     Physical Exam Updated Vital Signs BP (!) 120/46   Pulse 80   Temp 97.8 F (36.6 C) (Oral)   Resp 21   Ht 5\' 7"  (1.702 m)   Wt 115 lb (52.2 kg)   SpO2 100%   BMI 18.01 kg/m   Physical Exam  Constitutional: She appears well-developed.  cachectic  HENT:  Head: Normocephalic and atraumatic.  Eyes: Right eye exhibits no discharge.  Cardiovascular: Normal rate and regular rhythm.   Pulmonary/Chest: Effort normal.  Rhonchi R LL  Abdominal: Soft. There is no tenderness.  Neurological:  oreitned to person  Skin: Skin is warm and dry. She is not diaphoretic.  Old bruising diffusely  Psychiatric: She has a normal mood and affect.  Nursing note and vitals reviewed.    ED Treatments / Results  Labs (all labs ordered are listed, but only abnormal results are displayed) Labs Reviewed  BASIC METABOLIC PANEL - Abnormal; Notable for the following:       Result Value   Sodium 131 (*)    Potassium 3.1 (*)    Chloride 97 (*)    Glucose, Bld 129 (*)    BUN 31 (*)    Creatinine, Ser 2.39 (*)    Calcium 8.1 (*)    GFR calc non Af Amer 16 (*)    GFR calc Af  Amer 19 (*)    All other components within normal limits  URINALYSIS, ROUTINE W REFLEX MICROSCOPIC (NOT AT [facility]) - Abnormal; Notable for the following:    Leukocytes, UA SMALL (*)    All other components within normal limits  CBC WITH DIFFERENTIAL/PLATELET - Abnormal; Notable for the following:    RBC 3.58 (*)    Hemoglobin 10.1 (*)    HCT 31.0 (*)    Platelets 564 (*)    All other components within normal limits  URINE MICROSCOPIC-ADD ON - Abnormal; Notable for the following:    Squamous Epithelial / LPF 0-5 (*)  Bacteria, UA FEW (*)    All other components within normal limits  CBG MONITORING, ED - Abnormal; Notable for the following:    Glucose-Capillary 138 (*)    All other components within normal limits  I-STAT CG4 LACTIC ACID, ED - Abnormal; Notable for the following:    Lactic Acid, Venous 3.36 (*)    All other components within normal limits  I-STAT CG4 LACTIC ACID, ED - Abnormal; Notable for the following:    Lactic Acid, Venous 2.14 (*)    All other components within normal limits  CULTURE, BLOOD (ROUTINE X 2)  CULTURE, BLOOD (ROUTINE X 2)  URINE CULTURE  CK  I-STAT TROPOININ, ED    EKG  EKG Interpretation  Date/Time:  Friday April 30 2016 14:40:48 EDT Ventricular Rate:  88 PR Interval:    QRS Duration: 140 QT Interval:  421 QTC Calculation: 510 R Axis:   -26 Text Interpretation:  Sinus rhythm Multiple ventricular premature complexes Right bundle branch block Right bundle branch block No significant change since last tracing Confirmed by Kandis MannanMACKUEN, COURTNEY ([id number]) on 04/30/2016 3:21:36 PM       Radiology Dg Chest 2 View  Result Date: 04/30/2016 CLINICAL DATA:  New onset atrial fibrillation an weakness. Duration of symptoms 3 days. EXAM: CHEST  2 VIEW COMPARISON:  07/01/2015 FINDINGS: Heart size is normal. Aortic atherosclerosis. Mediastinal shadows are otherwise normal. There is chronic scarring at the left lung base. These markings are presumed to  represent chronic markings, but mild atelectasis or infiltrate at the left base is not excluded. No effusions. Arterial calcification is noted. No acute bone finding. IMPRESSION: Aortic atherosclerosis. Left base scarring. Cannot rule out some active atelectasis or infiltrate at the left base. Electronically Signed   By: Paulina FusiMark  Shogry M.D.   On: 04/30/2016 15:20    Procedures Procedures (including critical care time)  Medications Ordered in ED Medications  sodium chloride 0.9 % bolus 1,000 mL (0 mLs Intravenous Stopped 04/30/16 1601)    And  sodium chloride 0.9 % bolus 500 mL (500 mLs Intravenous New Bag/Given 04/30/16 1640)    And  sodium chloride 0.9 % bolus 250 mL (250 mLs Intravenous New Bag/Given 04/30/16 1630)  piperacillin-tazobactam (ZOSYN) IVPB 3.375 g (0 g Intravenous Stopped 04/30/16 1612)  vancomycin (VANCOCIN) IVPB 1000 mg/200 mL premix (0 mg Intravenous Stopped 04/30/16 1642)     Initial Impression / Assessment and Plan / ED Course  I have reviewed the triage vital signs and the nursing notes.  Pertinent labs & imaging results that were available during my care of the patient were reviewed by me and considered in my medical decision making (see chart for details).  Clinical Course    Patient is a 82 year old female with history of dementia, anxiety, failure to thrive presenting today with weakness. Patient had recent urinary tract infection diagnosed and started on Bactrim. Patient continued to be weak not eating. Patient very dry on exam, no focal symptoms. Patient's lactic elevated, called code sepsis. Septic dose fluids and antibiotics ordered. Likely urine as a source.    Urine came back, no impressive infection,though it is partially treated.  Clear dehydration on labs.  Will need to admit for AKI.  Vital signs remain stable.   CRITICAL CARE Performed by: Arlana Hoveourteney L Britton Perkinson Total critical care time: 30 minutes Critical care time was exclusive of separately billable  procedures and treating other patients. Critical care was necessary to treat or prevent imminent or life-threatening deterioration. Critical care was time spent  personally by me on the following activities: development of treatment plan with patient and/or surrogate as well as nursing, discussions with consultants, evaluation of patient's response to treatment, examination of patient, obtaining history from patient or surrogate, ordering and performing treatments and interventions, ordering and review of laboratory studies, ordering and review of radiographic studies, pulse oximetry and re-evaluation of patient's condition.   Final Clinical Impressions(s) / ED Diagnoses   Final diagnoses:  None    New Prescriptions New Prescriptions   No medications on file     Wayde Gopaul Randall An, MD 04/30/16 1958

## 2016-04-30 NOTE — Progress Notes (Signed)
Pharmacy Antibiotic Note  Stephanie Colon is a 82 y.o. female admitted on 04/30/2016 with sepsis.  Pharmacy has been consulted for vancomycin and zosyn dosing.  Pt received vancomycin 1g and zosyn 3.375g IV once in the ED.  Plan: Vancomycin 500mg  IV every 48 hours.  Goal trough 15-20 mcg/mL.  Zosyn 2.25g IV q8h  Monitor culture data, renal function and clinical course VT at SS prn  Height: 5\' 7"  (170.2 cm) Weight: 115 lb (52.2 kg) IBW/kg (Calculated) : 61.6  Temp (24hrs), Avg:97.8 F (36.6 C), Min:97.8 F (36.6 C), Max:97.8 F (36.6 C)   Recent Labs Lab 04/30/16 1446 04/30/16 1449 04/30/16 1457  WBC  --  7.6  --   CREATININE 2.39*  --   --   LATICACIDVEN  --   --  3.36*    CrCl cannot be calculated (Patient's most recent lab result is older than the maximum 21 days allowed.).    Allergies  Allergen Reactions  . Aspirin     REACTION: weak blood vessels  . Citalopram Hydrobromide   . Fosinopril Sodium     REACTION: unknown    Antimicrobials this admission: Vanc 9/8 >>  Zosyn 9/8 >>   Dose adjustments this admission: N/a  Microbiology results:  BCx:   UCx:    Sputum:    MRSA PCR:    Arlean Hoppingorey M. Newman PiesBall, PharmD, BCPS Clinical Pharmacist Pager [phone]

## 2016-04-30 NOTE — ED Triage Notes (Signed)
[facility] who report increasing weakness since last Saturday.  EMS reports pt diagnosed with UTI and started on Bactrim.  Per EMS staff report pt increasingly weak, anorexic, and not wanting to get out of bed.  EMS reports pt in afib with no hx of same.

## 2016-05-01 ENCOUNTER — Encounter (HOSPITAL_COMMUNITY): Payer: Self-pay | Admitting: *Deleted

## 2016-05-01 LAB — TROPONIN I
TROPONIN I: 0.05 ng/mL — AB (ref <0.03)
Troponin I: 0.05 ng/mL (ref <0.03)
Troponin I: 0.05 ng/mL (ref <0.03)

## 2016-05-01 LAB — CBC
HCT: 29 % — ABNORMAL LOW (ref 36.0–46.0)
Hemoglobin: 9.3 g/dL — ABNORMAL LOW (ref 12.0–15.0)
MCH: 27.6 pg (ref 26.0–34.0)
MCHC: 32.1 g/dL (ref 30.0–36.0)
MCV: 86.1 fL (ref 78.0–100.0)
PLATELETS: 478 10*3/uL — AB (ref 150–400)
RBC: 3.37 MIL/uL — AB (ref 3.87–5.11)
RDW: 13.7 % (ref 11.5–15.5)
WBC: 7 10*3/uL (ref 4.0–10.5)

## 2016-05-01 LAB — COMPREHENSIVE METABOLIC PANEL
ALK PHOS: 52 U/L (ref 38–126)
ALT: 11 U/L — AB (ref 14–54)
AST: 22 U/L (ref 15–41)
Albumin: 1.9 g/dL — ABNORMAL LOW (ref 3.5–5.0)
Anion gap: 10 (ref 5–15)
BILIRUBIN TOTAL: 0.5 mg/dL (ref 0.3–1.2)
BUN: 23 mg/dL — ABNORMAL HIGH (ref 6–20)
CALCIUM: 8 mg/dL — AB (ref 8.9–10.3)
CO2: 23 mmol/L (ref 22–32)
CREATININE: 1.83 mg/dL — AB (ref 0.44–1.00)
Chloride: 103 mmol/L (ref 101–111)
GFR, EST AFRICAN AMERICAN: 26 mL/min — AB (ref >60)
GFR, EST NON AFRICAN AMERICAN: 22 mL/min — AB (ref >60)
Glucose, Bld: 75 mg/dL (ref 65–99)
Potassium: 2.4 mmol/L — CL (ref 3.5–5.1)
Sodium: 136 mmol/L (ref 135–145)
TOTAL PROTEIN: 5.4 g/dL — AB (ref 6.5–8.1)

## 2016-05-01 LAB — PHOSPHORUS: Phosphorus: 1.6 mg/dL — ABNORMAL LOW (ref 2.5–4.6)

## 2016-05-01 LAB — MAGNESIUM: MAGNESIUM: 1.2 mg/dL — AB (ref 1.7–2.4)

## 2016-05-01 LAB — TSH: TSH: 1.532 u[IU]/mL (ref 0.350–4.500)

## 2016-05-01 LAB — MRSA PCR SCREENING: MRSA by PCR: POSITIVE — AB

## 2016-05-01 LAB — LACTIC ACID, PLASMA
LACTIC ACID, VENOUS: 1.2 mmol/L (ref 0.5–1.9)
Lactic Acid, Venous: 1 mmol/L (ref 0.5–1.9)

## 2016-05-01 LAB — CREATININE, URINE, RANDOM: CREATININE, URINE: 110.18 mg/dL

## 2016-05-01 LAB — SODIUM, URINE, RANDOM: SODIUM UR: 73 mmol/L

## 2016-05-01 MED ORDER — MAGNESIUM SULFATE 4 GM/100ML IV SOLN
4.0000 g | Freq: Once | INTRAVENOUS | Status: AC
  Administered 2016-05-01: 4 g via INTRAVENOUS
  Filled 2016-05-01: qty 100

## 2016-05-01 MED ORDER — ENSURE ENLIVE PO LIQD
237.0000 mL | Freq: Three times a day (TID) | ORAL | Status: DC
  Administered 2016-05-01 – 2016-05-06 (×10): 237 mL via ORAL

## 2016-05-01 MED ORDER — ACETAMINOPHEN 325 MG PO TABS
650.0000 mg | ORAL_TABLET | Freq: Four times a day (QID) | ORAL | Status: DC | PRN
Start: 2016-05-01 — End: 2016-05-06
  Administered 2016-05-05: 650 mg via ORAL
  Filled 2016-05-01 (×2): qty 2

## 2016-05-01 MED ORDER — GUAIFENESIN 100 MG/5ML PO SOLN
200.0000 mg | Freq: Three times a day (TID) | ORAL | Status: DC | PRN
  Filled 2016-05-01: qty 10

## 2016-05-01 MED ORDER — ACETAMINOPHEN 650 MG RE SUPP: 650.0000 mg | Freq: Four times a day (QID) | RECTAL | Status: DC | PRN

## 2016-05-01 MED ORDER — TRAMADOL HCL 50 MG PO TABS
50.0000 mg | ORAL_TABLET | Freq: Four times a day (QID) | ORAL | Status: DC | PRN
  Administered 2016-05-01: 50 mg via ORAL
  Filled 2016-05-01: qty 1

## 2016-05-01 MED ORDER — POTASSIUM CHLORIDE CRYS ER 20 MEQ PO TBCR
40.0000 meq | EXTENDED_RELEASE_TABLET | ORAL | Status: AC
  Administered 2016-05-01 (×3): 40 meq via ORAL
  Filled 2016-05-01 (×2): qty 2

## 2016-05-01 MED ORDER — BACITRACIN-NEOMYCIN-POLYMYXIN OINTMENT TUBE: 1.0000 "application " | TOPICAL_OINTMENT | CUTANEOUS | Status: DC | PRN

## 2016-05-01 MED ORDER — LOSARTAN POTASSIUM 25 MG PO TABS
25.0000 mg | ORAL_TABLET | Freq: Every day | ORAL | Status: DC
  Administered 2016-05-01 – 2016-05-03 (×3): 25 mg via ORAL
  Filled 2016-05-01 (×3): qty 1

## 2016-05-01 MED ORDER — SODIUM CHLORIDE 0.9 % IV SOLN
INTRAVENOUS | Status: DC
  Administered 2016-05-01: 01:00:00 via INTRAVENOUS

## 2016-05-01 MED ORDER — CHLORHEXIDINE GLUCONATE CLOTH 2 % EX PADS
6.0000 | MEDICATED_PAD | Freq: Every day | CUTANEOUS | Status: AC
  Administered 2016-05-01 – 2016-05-06 (×5): 6 via TOPICAL

## 2016-05-01 MED ORDER — SODIUM CHLORIDE 0.9% FLUSH
3.0000 mL | Freq: Two times a day (BID) | INTRAVENOUS | Status: DC
  Administered 2016-05-01 – 2016-05-06 (×10): 3 mL via INTRAVENOUS

## 2016-05-01 MED ORDER — ENOXAPARIN SODIUM 60 MG/0.6ML ~~LOC~~ SOLN
50.0000 mg | SUBCUTANEOUS | Status: DC
  Administered 2016-05-01 – 2016-05-04 (×4): 50 mg via SUBCUTANEOUS
  Filled 2016-05-01 (×4): qty 0.6

## 2016-05-01 MED ORDER — HYDROCODONE-ACETAMINOPHEN 5-325 MG PO TABS
1.0000 | ORAL_TABLET | ORAL | Status: DC | PRN
  Administered 2016-05-01 – 2016-05-02 (×2): 2 via ORAL
  Filled 2016-05-01 (×2): qty 2

## 2016-05-01 MED ORDER — PIPERACILLIN-TAZOBACTAM IN DEX 2-0.25 GM/50ML IV SOLN
2.2500 g | Freq: Three times a day (TID) | INTRAVENOUS | Status: DC
  Filled 2016-05-01 (×3): qty 50

## 2016-05-01 MED ORDER — MUPIROCIN 2 % EX OINT
1.0000 "application " | TOPICAL_OINTMENT | Freq: Two times a day (BID) | CUTANEOUS | Status: AC
  Administered 2016-05-01 – 2016-05-06 (×10): 1 via NASAL
  Filled 2016-05-01: qty 22

## 2016-05-01 MED ORDER — DEXTROSE 5 % IV SOLN
1.0000 g | INTRAVENOUS | Status: DC
  Administered 2016-05-01 (×2): 1 g via INTRAVENOUS
  Filled 2016-05-01 (×2): qty 10

## 2016-05-01 MED ORDER — POTASSIUM CHLORIDE 2 MEQ/ML IV SOLN
INTRAVENOUS | Status: DC
  Administered 2016-05-01 – 2016-05-03 (×2): via INTRAVENOUS
  Filled 2016-05-01 (×7): qty 1000

## 2016-05-01 MED ORDER — ENSURE ENLIVE PO LIQD: 237.0000 mL | Freq: Two times a day (BID) | ORAL | Status: DC

## 2016-05-01 MED ORDER — VANCOMYCIN HCL 500 MG IV SOLR: 500.0000 mg | INTRAVENOUS | Status: DC

## 2016-05-01 NOTE — Progress Notes (Signed)
ANTICOAGULATION CONSULT NOTE - Initial Consult  Pharmacy Consult for Lovenox Indication: atrial fibrillation  Allergies  Allergen Reactions  . Aspirin Other (See Comments)    REACTION: weak blood vessels-per MD   . Citalopram Hydrobromide Other (See Comments)  . Fosinopril Sodium Other (See Comments)    REACTION: unknown    Patient Measurements: Height: 5\' 7"  (170.2 cm) Weight: 115 lb 1.3 oz (52.2 kg) IBW/kg (Calculated) : 61.6    Vital Signs: Temp: 98.1 F (36.7 C) (09/08 2354) Temp Source: Axillary (09/08 2354) BP: 153/54 (09/08 2354) Pulse Rate: 74 (09/08 2354)  Labs:  Recent Labs  04/30/16 1446 04/30/16 1449 04/30/16 1952  HGB  --  10.1*  --   HCT  --  31.0*  --   PLT  --  564*  --   CREATININE 2.39*  --   --   CKTOTAL  --   --  104    Estimated Creatinine Clearance: 11.6 mL/min (by C-G formula based on SCr of 2.39 mg/dL).   Medical History: Past Medical History:  Diagnosis Date  . Alzheimer disease   . Anemia   . Hypertension   . Renal disorder     Medications:  See EMR   Assessment: 82 yo female with new onset AFib. To start Lovenox.  Currently in AKI, baseline Scr 1.1 - 1.3. CBC stable.  Goal of Therapy:  Anti-Xa level 0.6-1 units/ml 4hrs after LMWH dose given Monitor platelets by anticoagulation protocol: Yes   Plan:  -Lovenox 50 mg [place] q24h -Monitor renal fx, transition to PO anticoagulation    Baldemar FridayMasters, Amelia Burgard M 05/01/2016,12:27 AM

## 2016-05-01 NOTE — Progress Notes (Signed)
Initial Nutrition Assessment  DOCUMENTATION CODES:   Severe malnutrition in context of acute illness/injury, Underweight  INTERVENTION:  - Continue Ensure Enlive but will increase to TID, each supplement provides 350 kcal and 20 grams of protein. - Encourage pt with meals and supplements, may need reminders that items are in front of her. - Monitor magnesium, potassium, and phosphorus daily for at least 3 days, MD to replete as needed, as pt is at risk for refeeding syndrome given severe malnutrition, already low Mg, K, and Phos. - RD will continue to monitor for additional nutrition-related needs.  NUTRITION DIAGNOSIS:   Inadequate oral intake related to poor appetite as evidenced by per patient/family report, meal completion < 25%.  GOAL:   Patient will meet greater than or equal to 90% of their needs  MONITOR:   PO intake, Supplement acceptance, Weight trends, Labs, I & O's  REASON FOR ASSESSMENT:   Malnutrition Screening Tool, Consult Assessment of nutrition requirement/status  ASSESSMENT:   82 y.o. female with medical history significant of  Alzheimer's dementia, hypertension, hyperlipidemia, depression, anxiety,  L1 superior endplate compression chronic diastolic CHF. Presented with overall generalized fatigue for the past few days. 5 days ago patient was diagnosed with UTI and started on Bactrim but she's been getting progressively weak and hasn't been eating well and refusing to get out of bed. She's been gradually getting weaker patient has not endorsed any fevers but per records may have been febrile at the facility; she's been confused more than usual.  Pt seen for MST and consult. BMI indicates underweight status. No intakes documented since admission. Pt with Alzheimer's dementia and was very confused throughout visit, unable to provide any information. Pt's niece is at bedside and reports all information. PTA, pt has had an poor appetite for 2-3 years. She moved to a  facility 9 months ago and since that time refusal to eat, stating that she will get nauseated, has worsened. Over the past 1 month pt has mainly refused all meals but will sometimes drink vanilla Ensure dependent on the staff who provides supplement to her. Pt often complains of nausea and near vomiting following all PO intakes; niece states that pt does this in an attempt to avoid eating and that pt desires to lay in bed all day and be left alone. Niece states that she often encourages pt throughout meals and ensures she eats at least a few bites.   Niece states that pt drank 1.5 bottles of Ensure and had a cup of applesauce for breakfast and that she ate a few bites of each item on lunch tray (still <25% of this meal) and that this is more than pt has consumed in the past 1 week. She reports that pt often forgets that items, such as a drink, are in front of her and needs reminded of the same.   Unable to perform physical assessment d/t pt's current mental status but able to visualize some degree of muscle and fat wasting to upper body. Per niece, pt has lost 10 lbs in the past 1 month. Based on CBW, this indicates 8% body weight loss which is significant for time frame. No recent weight hx available in the chart for comparison. Pt meets criteria for malnutrition based on reported weight loss and <50% energy intake for >/= 5 days.   Medications reviewed; 4 g IV Mg sulfate x1 dose today, 40 mEq oral KCl every 4 hours x3 doses today. Labs reviewed; BUN: 23 mg/dL, creatinine: 4.091.83 mg/dL,  Ca: 8 mg/dL, GFR: 22 mL/min, K: 2.4 mmol/L, Phos: 1.6 mg/dL, Mg: 1.2 mg/dL.   Diet Order:  Diet Heart Room service appropriate? Yes; Fluid consistency: Thin  Skin:  Reviewed, no issues  Last BM:  9/9  Height:   Ht Readings from Last 1 Encounters:  04/30/16 5\' 7"  (1.702 m)    Weight:   Wt Readings from Last 1 Encounters:  04/30/16 115 lb 1.3 oz (52.2 kg)    Ideal Body Weight:  61.36 kg  BMI:  Body mass  index is 18.02 kg/m.  Estimated Nutritional Needs:   Kcal:  1050-1250  Protein:  45-55 grams  Fluid:  1.2-1.5 L/day  EDUCATION NEEDS:   No education needs identified at this time    Trenton Gammon, MS, RD, LDN Inpatient Clinical Dietitian Pager # [phone] After hours/weekend pager # [phone]

## 2016-05-01 NOTE — Progress Notes (Signed)
New Admission Note:   Arrival Method: Via stretcher from the ED Mental Orientation:  A & O x 3 - disoriented to time Telemetry:   Placed on Tele Box 6E08 - new onset AFIB but currently in NSR Assessment: Completed Skin:  Dry and flaky IV:  Left forearm NSL and Rt wrist NSL Pain:   C/o generalized soreness Tubes:  None Safety Measures: Safety Fall Prevention Plan has been given, discussed and signed Admission: Completed [address] Orientation: Patient has been orientated to the room, unit and staff.  Family:  No family is at bedside.  [facility]  No family at bedside.  Orders have been reviewed and implemented. Will continue to monitor the patient. Call light has been placed within reach and bed alarm has been activated.   Bernie CoveyKimberly Sahirah Rudell RN- Marsa ArisBC, [place] Phone number: [phone]

## 2016-05-01 NOTE — Progress Notes (Signed)
[facility]  PROGRESS NOTE  AMAI CAPPIELLO ZOX:[id number] DOB: 1919/04/25 DOA: 04/30/2016 PCP: Kirt Boys, DO    Brief HPI:   82 y.o. female with medical history significant of Alzheimer's dementia, hypertension, hyperlipidemia, depression, anxiety, L1 superior endplate compression chronic diastolic CHF   Presented with overall generalized fatigue for the past few days. 5 days ago patient was diagnosed with UTI and started on Bactrim but she's been getting progressively weak and hasn't been eating well and refusing to get out of bed    Assessment/Plan:    1. AKI- Patient presented with acute kidney injury with creatinine 2.39. Start IV fluids and creatinine is down to 1.83. Will continue with IV fluids. 2. Hypokalemia - this morning potassium is 2.4, magnesium was 1.2. Replace magnesium and potassium and check both BMP and magnesium in a.m.  3.  New onset atrial fibrillation -patient has new onset atrial fibrillation, heart rate is controlled.continue Lovenox per pharmacy  4.  UTI- patient has been on Septra for past 3 days and was recently switched to Cipro. UA in the hospital is negative. Patient empirically started on ceftriaxone. She has been afebrile, will give another dose of ceftriaxone and discontinue in a.m.  5.  Prolonged QT interval- QTc interval is 510, replace magnesium. Will observe.    DVT prophylaxis:Lovenox Code Status: DNR Family Communication: No family at bedside Disposition Plan: To be decided   Consultants:  Cardiology  Procedures:  None   Antibiotics:  None   Subjective   Patient seen and examined, denies any complaints.  Objective    Objective: Vitals:   04/30/16 2330 04/30/16 2354 05/01/16 0449 05/01/16 0900  BP: 116/70 (!) 153/54 (!) 136/40 (!) 122/41  Pulse:  74 68 85  Resp: 22 (!) 21 19 18   Temp:  98.1 F (36.7 C) 97.6 F (36.4 C) 98.5 F (36.9 C)  TempSrc:  Axillary Oral Oral  SpO2:  100% 100% 98%  Weight:  52.2 kg  (115 lb 1.3 oz)    Height:  5\' 7"  (1.702 m)      Intake/Output Summary (Last 24 hours) at 05/01/16 1130 Last data filed at 05/01/16 0907  Gross per 24 hour  Intake           936.67 ml  Output                0 ml  Net           936.67 ml   Filed Weights   04/30/16 1435 04/30/16 2354  Weight: 52.2 kg (115 lb) 52.2 kg (115 lb 1.3 oz)    Examination:  General exam: Appears calm and comfortable  Respiratory system: Clear to auscultation. Respiratory effort normal. Cardiovascular system: S1 & S2 heard, irregular. No JVD, murmurs, rubs, gallops or clicks. No pedal edema. Gastrointestinal system: Abdomen is nondistended, soft and nontender. No organomegaly or masses felt. Normal bowel sounds heard. Central nervous system: Alert and oriented. No focal neurological deficits. Extremities: Symmetric 5 x 5 power. Skin: No rashes, lesions or ulcers Psychiatry: Judgement and insight appear normal. Mood & affect appropriate.    Data Reviewed: I have personally reviewed following labs and imaging studies Basic Metabolic Panel:  Recent Labs Lab 04/30/16 1446 05/01/16 0538  NA 131* 136  K 3.1* 2.4*  CL 97* 103  CO2 22 23  GLUCOSE 129* 75  BUN 31* 23*  CREATININE 2.39* 1.83*  CALCIUM 8.1* 8.0*  MG  --  1.2*  PHOS  --  1.6*  Liver Function Tests:  Recent Labs Lab 05/01/16 0538  AST 22  ALT 11*  ALKPHOS 52  BILITOT 0.5  PROT 5.4*  ALBUMIN 1.9*   No results for input(s): LIPASE, AMYLASE in the last 168 hours. No results for input(s): AMMONIA in the last 168 hours. CBC:  Recent Labs Lab 04/30/16 1449 05/01/16 0538  WBC 7.6 7.0  NEUTROABS 5.9  --   HGB 10.1* 9.3*  HCT 31.0* 29.0*  MCV 86.6 86.1  PLT 564* 478*   Cardiac Enzymes:  Recent Labs Lab 04/30/16 1952 05/01/16 0139 05/01/16 0538  CKTOTAL 104  --   --   TROPONINI  --  0.05* 0.05*   BNP (last 3 results) No results for input(s): BNP in the last 8760 hours.  ProBNP (last 3 results) No results for  input(s): PROBNP in the last 8760 hours.  CBG:  Recent Labs Lab 04/30/16 1449  GLUCAP 138*    Recent Results (from the past 240 hour(s))  Culture, blood (Routine x 2)     Status: None (Preliminary result)   Collection Time: 04/30/16  3:30 PM  Result Value Ref Range Status   Specimen Description BLOOD RIGHT ANTECUBITAL  Final   Special Requests BOTTLES DRAWN AEROBIC ONLY  5CC  Final   Culture NO GROWTH < 24 HOURS  Final   Report Status PENDING  Incomplete  Culture, blood (Routine x 2)     Status: None (Preliminary result)   Collection Time: 04/30/16  3:35 PM  Result Value Ref Range Status   Specimen Description BLOOD LEFT ANTECUBITAL  Final   Special Requests IN PEDIATRIC BOTTLE  3CC  Final   Culture NO GROWTH < 24 HOURS  Final   Report Status PENDING  Incomplete     Studies: Dg Chest 2 View  Result Date: 04/30/2016 CLINICAL DATA:  New onset atrial fibrillation an weakness. Duration of symptoms 3 days. EXAM: CHEST  2 VIEW COMPARISON:  07/01/2015 FINDINGS: Heart size is normal. Aortic atherosclerosis. Mediastinal shadows are otherwise normal. There is chronic scarring at the left lung base. These markings are presumed to represent chronic markings, but mild atelectasis or infiltrate at the left base is not excluded. No effusions. Arterial calcification is noted. No acute bone finding. IMPRESSION: Aortic atherosclerosis. Left base scarring. Cannot rule out some active atelectasis or infiltrate at the left base. Electronically Signed   By: Paulina FusiMark  Shogry M.D.   On: 04/30/2016 15:20    Scheduled Meds: . cefTRIAXone (ROCEPHIN) IVPB 1 gram/50 mL D5W  1 g Intravenous Q24H  . enoxaparin (LOVENOX) injection  50 mg Subcutaneous Q24H  . feeding supplement (ENSURE ENLIVE)  237 mL Oral BID BM  . losartan  25 mg Oral Daily  . potassium chloride  40 mEq Oral Q4H  . sodium chloride flush  3 mL Intravenous Q12H   Continuous Infusions: . sodium chloride 0.9 % 1,000 mL with potassium chloride 20  mEq infusion 75 mL/hr at 05/01/16 0907       Time spent: 25 min    [facility],Elsie Baynes S  [facility] Pager [phone]. If 7PM-7AM, please contact night-coverage at [email or web address], Office  [phone]  password [email or web address] 05/01/2016, 11:30 AM  LOS: 1 day

## 2016-05-01 NOTE — Evaluation (Signed)
Physical Therapy Evaluation Patient Details Name: Stephanie Colon MRN: [id number] DOB: Mar 21, 1919 Today's Date: 05/01/2016   History of Present Illness  Patient is a 82 yo female admitted 04/30/16 with weakness, dehydration, increased confusion.  Patient wtih AKI, UTI, Afib.   PMH:  Alzheimer's dementia, anemia, HTN, CKD, anxiety, depression, CHF  Clinical Impression  Patient presents with problems listed below.  Will benefit from acute PT to maximize functional mobility prior to d/c back to ALF.  Recommend f/u HHPT at d/c.    Follow Up Recommendations Home health PT;Supervision/Assistance - 24 hour    Equipment Recommendations  Wheelchair (measurements PT);Wheelchair cushion (measurements PT)    Recommendations for Other Services       Precautions / Restrictions Precautions Precautions: Fall Restrictions Weight Bearing Restrictions: No      Mobility  Bed Mobility Overal bed mobility: Needs Assistance Bed Mobility: Supine to Sit     Supine to sit: Min assist     General bed mobility comments: Patient able to initiate mobility.  Required assist to complete upright position of trunk and to scoot hips to EOB in sitting.  Transfers Overall transfer level: Needs assistance Equipment used: Rolling walker (2 wheeled) Transfers: Sit to/from Stand Sit to Stand: Mod assist         General transfer comment: Verbal cues for hand placement.  Requires assist to power up to stance from bed and BSC.  Cues for hand placement to sit in chair.  Ambulation/Gait Ambulation/Gait assistance: Min assist Ambulation Distance (Feet): 38 Feet (30' and 8' with sitting rest break) Assistive device: Rolling walker (2 wheeled) Gait Pattern/deviations: Step-through pattern;Decreased step length - right;Decreased step length - left;Decreased stride length;Shuffle;Trunk flexed Gait velocity: decreased   General Gait Details: Verbal cues to stand upright and look forward during gait.  Patient takes  short, shuffling steps.  Assist for balance and to maneuver RW in turns.  Stairs            Wheelchair Mobility    Modified Rankin (Stroke Patients Only)       Balance Overall balance assessment: Needs assistance Sitting-balance support: No upper extremity supported;Feet supported Sitting balance-Leahy Scale: Fair     Standing balance support: Bilateral upper extremity supported Standing balance-Leahy Scale: Poor Standing balance comment: UE support and assist to maintain balance.                             Pertinent Vitals/Pain Pain Assessment: Faces Faces Pain Scale: Hurts even more Pain Location: back Pain Descriptors / Indicators: Aching Pain Intervention(s): Limited activity within patient's tolerance;Monitored during session;Repositioned    Home Living Family/patient expects to be discharged to:: Assisted living               Home Equipment: Walker - 2 wheels      Prior Function Level of Independence: Independent with assistive device(s);Needs assistance   Gait / Transfers Assistance Needed: Ambulates with RW  ADL's / Homemaking Assistance Needed: Facility provides meals and housekeeping.  Will help with ADL's if needed per neice.        Hand Dominance        Extremity/Trunk Assessment   Upper Extremity Assessment: Generalized weakness           Lower Extremity Assessment: Generalized weakness      Cervical / Trunk Assessment: Kyphotic  Communication   Communication: No difficulties  Cognition Arousal/Alertness: Awake/alert Behavior During Therapy: WFL for tasks assessed/performed;Anxious Overall Cognitive  Status: History of cognitive impairments - at baseline (Oriented x 1 only)       Memory: Decreased short-term memory              General Comments      Exercises        Assessment/Plan    PT Assessment Patient needs continued PT services  PT Diagnosis Difficulty walking;Generalized weakness;Acute  pain;Altered mental status   PT Problem List Decreased strength;Decreased activity tolerance;Decreased balance;Decreased mobility;Decreased cognition;Decreased knowledge of use of DME;Decreased safety awareness;Pain  PT Treatment Interventions DME instruction;Gait training;Functional mobility training;Therapeutic activities;Therapeutic exercise;Patient/family education   PT Goals (Current goals can be found in the Care Plan section) Acute Rehab PT Goals Patient Stated Goal: To lay down PT Goal Formulation: With patient/family Time For Goal Achievement: 05/08/16 Potential to Achieve Goals: Fair    Frequency Min 3X/week   Barriers to discharge        Co-evaluation               End of Session Equipment Utilized During Treatment: Gait belt Activity Tolerance: Patient limited by pain;Patient limited by fatigue Patient left: in chair;with call bell/phone within reach;with chair alarm set;with family/visitor present Nurse Communication: Mobility status;Other (comment) (Patient had BM during session)         Time: 1022-1059 PT Time Calculation (min) (ACUTE ONLY): 37 min   Charges:   PT Evaluation $PT Eval Moderate Complexity: 1 Procedure PT Treatments $Gait Training: 8-22 mins   PT G Codes:        Vena AustriaDavis, Analiah Drum H 05/01/2016, 11:16 AM Durenda HurtSusan H. Renaldo Fiddleravis, PT, [facility] CenterMBA Acute Rehab Services Pager [phone]

## 2016-05-02 ENCOUNTER — Inpatient Hospital Stay (HOSPITAL_COMMUNITY): Payer: Medicare Other

## 2016-05-02 DIAGNOSIS — E43 Unspecified severe protein-calorie malnutrition: Secondary | ICD-10-CM | POA: Insufficient documentation

## 2016-05-02 DIAGNOSIS — I4891 Unspecified atrial fibrillation: Secondary | ICD-10-CM

## 2016-05-02 LAB — ECHOCARDIOGRAM COMPLETE
Height: 67 in
WEIGHTICAEL: 1841.28 [oz_av]

## 2016-05-02 LAB — URINE CULTURE

## 2016-05-02 NOTE — Progress Notes (Signed)
  Echocardiogram 2D Echocardiogram has been performed.  Stephanie Colon 05/02/2016, 11:20 AM

## 2016-05-02 NOTE — Progress Notes (Signed)
[facility]  PROGRESS NOTE  Stephanie Colon GNF:[id number] DOB: 1919-05-15 DOA: 04/30/2016 PCP: Kirt Boysarter, Monica, DO    Brief HPI:   82 y.o. female with medical history significant of Alzheimer's dementia, hypertension, hyperlipidemia, depression, anxiety, L1 superior endplate compression chronic diastolic CHF   Presented with overall generalized fatigue for the past few days. 5 days ago patient was diagnosed with UTI and started on Bactrim but she's been getting progressively weak and hasn't been eating well and refusing to get out of bed    Assessment/Plan:    1. AKI- Patient presented with acute kidney injury with creatinine 2.39. Start IV fluids and creatinine is down to 1.83. Will continue with IV fluids. 2. Hypokalemia - potassium was 2.4, magnesium was 1.2. Replaced magnesium and potassium and ordered labs, pending.  3.  New onset atrial fibrillation -patient has new onset atrial fibrillation, heart rate is controlled.continue Lovenox per pharmacy  4.  UTI- patient has been on Septra for past 3 days and was recently switched to Cipro. UA in the hospital is negative. Patient empirically started on ceftriaxone. She has been afebrile, will given another dose of ceftriaxone and discontinue today 5.  Prolonged QT interval- QTc interval is 510, replace magnesium. Will observe.    DVT prophylaxis:Lovenox Code Status: DNR Family Communication: No family at bedside Disposition Plan: To be decided   Consultants:  Cardiology  Procedures:  None   Antibiotics:  None   Subjective   Patient seen and examined, denies any complaints. Labs are pending.  Objective    Objective: Vitals:   05/01/16 1712 05/01/16 2101 05/02/16 0448 05/02/16 0748  BP: (!) 137/56 (!) 137/50 (!) 153/53 (!) 173/74  Pulse: 75 75 80 93  Resp: 18 18 19 18   Temp: 97.8 F (36.6 C) 99.2 F (37.3 C) 97.3 F (36.3 C) 97.9 F (36.6 C)  TempSrc: Oral Oral Oral Oral  SpO2: 100% 98% 98% 93%  Weight:       Height:        Intake/Output Summary (Last 24 hours) at 05/02/16 1008 Last data filed at 05/02/16 0800  Gross per 24 hour  Intake          2256.25 ml  Output              450 ml  Net          1806.25 ml   Filed Weights   04/30/16 1435 04/30/16 2354  Weight: 52.2 kg (115 lb) 52.2 kg (115 lb 1.3 oz)    Examination:  General exam: Appears calm and comfortable  Respiratory system: Clear to auscultation. Respiratory effort normal. Cardiovascular system: S1 & S2 heard, irregular. No JVD, murmurs, rubs, gallops or clicks. No pedal edema. Gastrointestinal system: Abdomen is nondistended, soft and nontender. No organomegaly or masses felt. Normal bowel sounds heard. Central nervous system: Alert and oriented. No focal neurological deficits. Extremities: Symmetric 5 x 5 power. Skin: No rashes, lesions or ulcers Psychiatry: Judgement and insight appear normal. Mood & affect appropriate.    Data Reviewed: I have personally reviewed following labs and imaging studies Basic Metabolic Panel:  Recent Labs Lab 04/30/16 1446 05/01/16 0538  NA 131* 136  K 3.1* 2.4*  CL 97* 103  CO2 22 23  GLUCOSE 129* 75  BUN 31* 23*  CREATININE 2.39* 1.83*  CALCIUM 8.1* 8.0*  MG  --  1.2*  PHOS  --  1.6*   Liver Function Tests:  Recent Labs Lab 05/01/16 0538  AST 22  ALT 11*  ALKPHOS 52  BILITOT 0.5  PROT 5.4*  ALBUMIN 1.9*   No results for input(s): LIPASE, AMYLASE in the last 168 hours. No results for input(s): AMMONIA in the last 168 hours. CBC:  Recent Labs Lab 04/30/16 1449 05/01/16 0538  WBC 7.6 7.0  NEUTROABS 5.9  --   HGB 10.1* 9.3*  HCT 31.0* 29.0*  MCV 86.6 86.1  PLT 564* 478*   Cardiac Enzymes:  Recent Labs Lab 04/30/16 1952 05/01/16 0139 05/01/16 0538 05/01/16 1135  CKTOTAL 104  --   --   --   TROPONINI  --  0.05* 0.05* 0.05*   BNP (last 3 results) No results for input(s): BNP in the last 8760 hours.  ProBNP (last 3 results) No results for  input(s): PROBNP in the last 8760 hours.  CBG:  Recent Labs Lab 04/30/16 1449  GLUCAP 138*    Recent Results (from the past 240 hour(s))  Culture, blood (Routine x 2)     Status: None (Preliminary result)   Collection Time: 04/30/16  3:30 PM  Result Value Ref Range Status   Specimen Description BLOOD RIGHT ANTECUBITAL  Final   Special Requests BOTTLES DRAWN AEROBIC ONLY  5CC  Final   Culture NO GROWTH < 24 HOURS  Final   Report Status PENDING  Incomplete  Culture, blood (Routine x 2)     Status: None (Preliminary result)   Collection Time: 04/30/16  3:35 PM  Result Value Ref Range Status   Specimen Description BLOOD LEFT ANTECUBITAL  Final   Special Requests IN PEDIATRIC BOTTLE  3CC  Final   Culture NO GROWTH < 24 HOURS  Final   Report Status PENDING  Incomplete  Urine culture     Status: Abnormal   Collection Time: 04/30/16  5:45 PM  Result Value Ref Range Status   Specimen Description URINE, RANDOM  Final   Special Requests NONE  Final   Culture MULTIPLE SPECIES PRESENT, SUGGEST RECOLLECTION (A)  Final   Report Status 05/02/2016 FINAL  Final  MRSA PCR Screening     Status: Abnormal   Collection Time: 05/01/16  6:08 PM  Result Value Ref Range Status   MRSA by PCR POSITIVE (A) NEGATIVE Final    Comment:        The GeneXpert MRSA Assay (FDA approved for NASAL specimens only), is one component of a comprehensive MRSA colonization surveillance program. It is not intended to diagnose MRSA infection nor to guide or monitor treatment for MRSA infections. RESULT CALLED TO, READ BACK BY AND VERIFIED WITH: RN Marilynn Rail AT [id number] MARTINB      Studies: Dg Chest 2 View  Result Date: 04/30/2016 CLINICAL DATA:  New onset atrial fibrillation an weakness. Duration of symptoms 3 days. EXAM: CHEST  2 VIEW COMPARISON:  07/01/2015 FINDINGS: Heart size is normal. Aortic atherosclerosis. Mediastinal shadows are otherwise normal. There is chronic scarring at the left lung base.  These markings are presumed to represent chronic markings, but mild atelectasis or infiltrate at the left base is not excluded. No effusions. Arterial calcification is noted. No acute bone finding. IMPRESSION: Aortic atherosclerosis. Left base scarring. Cannot rule out some active atelectasis or infiltrate at the left base. Electronically Signed   By: Paulina Fusi M.D.   On: 04/30/2016 15:20    Scheduled Meds: . Chlorhexidine Gluconate Cloth  6 each Topical Q0600  . enoxaparin (LOVENOX) injection  50 mg Subcutaneous Q24H  . feeding supplement (ENSURE ENLIVE)  237 mL Oral  TID BM  . losartan  25 mg Oral Daily  . mupirocin ointment  1 application Nasal BID  . sodium chloride flush  3 mL Intravenous Q12H   Continuous Infusions: . sodium chloride 0.9 % 1,000 mL with potassium chloride 20 mEq infusion 75 mL/hr at 05/01/16 0907       Time spent: 25 min    [facility]  [facility] Pager [phone]. If 7PM-7AM, please contact night-coverage at [email or web address], Office  [phone]  password [email or web address] 05/02/2016, 10:08 AM  LOS: 2 days

## 2016-05-03 DIAGNOSIS — I480 Paroxysmal atrial fibrillation: Secondary | ICD-10-CM

## 2016-05-03 DIAGNOSIS — I10 Essential (primary) hypertension: Secondary | ICD-10-CM

## 2016-05-03 LAB — CBC
HEMATOCRIT: 26.2 % — AB (ref 36.0–46.0)
HEMOGLOBIN: 8.2 g/dL — AB (ref 12.0–15.0)
MCH: 27.9 pg (ref 26.0–34.0)
MCHC: 31.3 g/dL (ref 30.0–36.0)
MCV: 89.1 fL (ref 78.0–100.0)
Platelets: 410 10*3/uL — ABNORMAL HIGH (ref 150–400)
RBC: 2.94 MIL/uL — AB (ref 3.87–5.11)
RDW: 14.5 % (ref 11.5–15.5)
WBC: 7 10*3/uL (ref 4.0–10.5)

## 2016-05-03 LAB — BASIC METABOLIC PANEL
ANION GAP: 4 — AB (ref 5–15)
BUN: 9 mg/dL (ref 6–20)
CHLORIDE: 114 mmol/L — AB (ref 101–111)
CO2: 20 mmol/L — AB (ref 22–32)
Calcium: 8.4 mg/dL — ABNORMAL LOW (ref 8.9–10.3)
Creatinine, Ser: 1.06 mg/dL — ABNORMAL HIGH (ref 0.44–1.00)
GFR calc non Af Amer: 43 mL/min — ABNORMAL LOW (ref >60)
GFR, EST AFRICAN AMERICAN: 50 mL/min — AB (ref >60)
Glucose, Bld: 78 mg/dL (ref 65–99)
POTASSIUM: 4.8 mmol/L (ref 3.5–5.1)
Sodium: 138 mmol/L (ref 135–145)

## 2016-05-03 LAB — C DIFFICILE QUICK SCREEN W PCR REFLEX
C Diff antigen: POSITIVE — AB
C Diff toxin: NEGATIVE

## 2016-05-03 LAB — CLOSTRIDIUM DIFFICILE BY PCR: Toxigenic C. Difficile by PCR: POSITIVE — AB

## 2016-05-03 MED ORDER — METOPROLOL TARTRATE 12.5 MG HALF TABLET
12.5000 mg | ORAL_TABLET | Freq: Two times a day (BID) | ORAL | Status: DC
  Administered 2016-05-03 – 2016-05-06 (×6): 12.5 mg via ORAL
  Filled 2016-05-03 (×6): qty 1

## 2016-05-03 MED ORDER — SODIUM CHLORIDE 0.9 % IV SOLN
INTRAVENOUS | Status: DC
  Administered 2016-05-03: 14:00:00 via INTRAVENOUS

## 2016-05-03 NOTE — Progress Notes (Signed)
[facility]  PROGRESS NOTE  Stephanie Colon AVW:[id number] DOB: 1919/05/15 DOA: 04/30/2016 PCP: Kirt Boys, DO    Brief HPI:   82 y.o. female with medical history significant of Alzheimer's dementia, hypertension, hyperlipidemia, depression, anxiety, L1 superior endplate compression chronic diastolic CHF   Presented with overall generalized fatigue for the past few days. 5 days ago patient was diagnosed with UTI and started on Bactrim but she's been getting progressively weak and hasn't been eating well and refusing to get out of bed    Assessment/Plan:    1. AKI- Patient presented with acute kidney injury with creatinine 2.39. Start IV fluids and creatinine is down to 1.06. Will continue with IV fluids. 2. Hypokalemia - potassium was 2.4,Replaced the potassium is 4.8 3. Hypomagnesemia- magnesium was 1.2. Replaced magnesium , check magnesium level in a.m. 4.  New onset atrial fibrillation -patient has new onset atrial fibrillation, heart rate is controlled.continue Lovenox per pharmacy . Cardiology consulted 5.  UTI- patient has been on Septra for past 3 days and was recently switched to Cipro. UA in the hospital is negative. Patient empirically started on ceftriaxone. She has been afebrile, will given another dose of ceftriaxone and discontinue today 6.  Prolonged QT interval- QTc interval is 510, replace magnesium. Will observe.    DVT prophylaxis:Lovenox Code Status: DNR Family Communication: No family at bedside Disposition Plan: To be decided   Consultants:  Cardiology  Procedures:  None   Antibiotics:  None   Subjective   Patient seen and examined, denies any complaints.   Objective    Objective: Vitals:   05/02/16 0748 05/02/16 1739 05/03/16 0631 05/03/16 0959  BP: (!) 173/74 (!) 149/55 (!) 137/52 (!) 146/58  Pulse: 93 91 78 81  Resp: 18 17 16 18   Temp: 97.9 F (36.6 C) 97.9 F (36.6 C) 97.7 F (36.5 C) 98.2 F (36.8 C)  TempSrc: Oral Oral  Oral Oral  SpO2: 93% 99% 97% 99%  Weight:      Height:        Intake/Output Summary (Last 24 hours) at 05/03/16 1334 Last data filed at 05/03/16 1000  Gross per 24 hour  Intake             2250 ml  Output              101 ml  Net             2149 ml   Filed Weights   04/30/16 1435 04/30/16 2354  Weight: 52.2 kg (115 lb) 52.2 kg (115 lb 1.3 oz)    Examination:  General exam: Appears calm and comfortable  Respiratory system: Clear to auscultation. Respiratory effort normal. Cardiovascular system: S1 & S2 heard, irregular. No JVD, murmurs, rubs, gallops or clicks. No pedal edema. Gastrointestinal system: Abdomen is nondistended, soft and nontender. No organomegaly or masses felt. Normal bowel sounds heard. Central nervous system: Alert and oriented. No focal neurological deficits. Extremities: Symmetric 5 x 5 power. Skin: No rashes, lesions or ulcers Psychiatry: Judgement and insight appear normal. Mood & affect appropriate.    Data Reviewed: I have personally reviewed following labs and imaging studies Basic Metabolic Panel:  Recent Labs Lab 04/30/16 1446 05/01/16 0538 05/03/16 0635  NA 131* 136 138  K 3.1* 2.4* 4.8  CL 97* 103 114*  CO2 22 23 20*  GLUCOSE 129* 75 78  BUN 31* 23* 9  CREATININE 2.39* 1.83* 1.06*  CALCIUM 8.1* 8.0* 8.4*  MG  --  1.2*  --  PHOS  --  1.6*  --    Liver Function Tests:  Recent Labs Lab 05/01/16 0538  AST 22  ALT 11*  ALKPHOS 52  BILITOT 0.5  PROT 5.4*  ALBUMIN 1.9*   No results for input(s): LIPASE, AMYLASE in the last 168 hours. No results for input(s): AMMONIA in the last 168 hours. CBC:  Recent Labs Lab 04/30/16 1449 05/01/16 0538 05/03/16 0635  WBC 7.6 7.0 7.0  NEUTROABS 5.9  --   --   HGB 10.1* 9.3* 8.2*  HCT 31.0* 29.0* 26.2*  MCV 86.6 86.1 89.1  PLT 564* 478* 410*   Cardiac Enzymes:  Recent Labs Lab 04/30/16 1952 05/01/16 0139 05/01/16 0538 05/01/16 1135  CKTOTAL 104  --   --   --   TROPONINI  --   0.05* 0.05* 0.05*   BNP (last 3 results) No results for input(s): BNP in the last 8760 hours.  ProBNP (last 3 results) No results for input(s): PROBNP in the last 8760 hours.  CBG:  Recent Labs Lab 04/30/16 1449  GLUCAP 138*    Recent Results (from the past 240 hour(s))  Culture, blood (Routine x 2)     Status: None (Preliminary result)   Collection Time: 04/30/16  3:30 PM  Result Value Ref Range Status   Specimen Description BLOOD RIGHT ANTECUBITAL  Final   Special Requests BOTTLES DRAWN AEROBIC ONLY  5CC  Final   Culture NO GROWTH 2 DAYS  Final   Report Status PENDING  Incomplete  Culture, blood (Routine x 2)     Status: None (Preliminary result)   Collection Time: 04/30/16  3:35 PM  Result Value Ref Range Status   Specimen Description BLOOD LEFT ANTECUBITAL  Final   Special Requests IN PEDIATRIC BOTTLE  3CC  Final   Culture NO GROWTH 2 DAYS  Final   Report Status PENDING  Incomplete  Urine culture     Status: Abnormal   Collection Time: 04/30/16  5:45 PM  Result Value Ref Range Status   Specimen Description URINE, RANDOM  Final   Special Requests NONE  Final   Culture MULTIPLE SPECIES PRESENT, SUGGEST RECOLLECTION (A)  Final   Report Status 05/02/2016 FINAL  Final  MRSA PCR Screening     Status: Abnormal   Collection Time: 05/01/16  6:08 PM  Result Value Ref Range Status   MRSA by PCR POSITIVE (A) NEGATIVE Final    Comment:        The GeneXpert MRSA Assay (FDA approved for NASAL specimens only), is one component of a comprehensive MRSA colonization surveillance program. It is not intended to diagnose MRSA infection nor to guide or monitor treatment for MRSA infections. RESULT CALLED TO, READ BACK BY AND VERIFIED WITH: RN Marilynn RailC WICKER AT 1946 [id number] MARTINB      Studies: No results found.  Scheduled Meds: . Chlorhexidine Gluconate Cloth  6 each Topical Q0600  . enoxaparin (LOVENOX) injection  50 mg Subcutaneous Q24H  . feeding supplement (ENSURE ENLIVE)   237 mL Oral TID BM  . losartan  25 mg Oral Daily  . mupirocin ointment  1 application Nasal BID  . sodium chloride flush  3 mL Intravenous Q12H   Continuous Infusions: . sodium chloride 20 mL/hr at 05/03/16 1334       Time spent: 25 min    [facility],Juventino Pavone S  [facility] Pager [phone]. If 7PM-7AM, please contact night-coverage at [email or web address], Office  [phone]  password [email or web address] 05/03/2016, 1:34 PM  LOS: 3 days

## 2016-05-03 NOTE — Evaluation (Signed)
Occupational Therapy Evaluation Patient Details Name: Stephanie Colon MRN: [id number] DOB: 05/26/1919 Today's Date: 05/03/2016    History of Present Illness Patient is a 82 yo female admitted 04/30/16 with weakness, dehydration, increased confusion.  Patient wtih AKI, UTI, Afib.   PMH:  Alzheimer's dementia, anemia, HTN, CKD, anxiety, depression, CHF   Clinical Impression   Pt with decline in function and safety with ADLs and ADL mobility with decreased strength, balance and endurance. Pt with hx of cognitive impairments. Pt would benefit from acute OT services to address impairments to increase level of function and safety    Follow Up Recommendations  Home health OT;Supervision/Assistance - 24 hour    Equipment Recommendations  None recommended by OT    Recommendations for Other Services       Precautions / Restrictions Precautions Precautions: Fall Restrictions Weight Bearing Restrictions: No      Mobility Bed Mobility Overal bed mobility: Needs Assistance Bed Mobility: Supine to Sit;Sit to Supine     Supine to sit: Min assist Sit to supine: Min assist   General bed mobility comments: min A with LEs back onto bed, mod A to scoot to [facility] with pt using rails  Transfers Overall transfer level: Needs assistance Equipment used: Rolling walker (2 wheeled) Transfers: Sit to/from Stand Sit to Stand: Min assist;Mod assist         General transfer comment: Verbal cues for hand placement.  Requires assist to power up to stance from bed and BSC.  Cues for hand placement     Balance     Sitting balance-Leahy Scale: Fair       Standing balance-Leahy Scale: Poor                              ADL Overall ADL's : Needs assistance/impaired     Grooming: Wash/dry hands;Wash/dry face;Sitting;Min guard   Upper Body Bathing: Min guard;Sitting   Lower Body Bathing: Moderate assistance   Upper Body Dressing : Min guard;Sitting   Lower Body Dressing: Moderate  assistance   Toilet Transfer: Moderate assistance;Minimal assistance;BSC;RW;Stand-pivot;Cueing for safety;Cueing for sequencing   Toileting- Clothing Manipulation and Hygiene: Maximal assistance       Functional mobility during ADLs: Minimal assistance;Moderate assistance;Cueing for safety;Cueing for sequencing;Rolling walker General ADL Comments: pt became briefly agitated with OT instructing her to sit EOB and for initiating ADLs     Vision  no change from baseline              Pertinent Vitals/Pain Pain Assessment: Faces Faces Pain Scale: Hurts little more Pain Location: B LEs with bed mobility to EOB Pain Descriptors / Indicators: Sore Pain Intervention(s): Limited activity within patient's tolerance;Monitored during session;Repositioned     Hand Dominance Right   Extremity/Trunk Assessment Upper Extremity Assessment Upper Extremity Assessment: Generalized weakness       Cervical / Trunk Assessment Cervical / Trunk Assessment: Kyphotic   Communication Communication Communication: No difficulties   Cognition Arousal/Alertness: Awake/alert Behavior During Therapy: WFL for tasks assessed/performed;Anxious;Agitated Overall Cognitive Status: History of cognitive impairments - at baseline       Memory: Decreased short-term memory             General Comments   pt cooperative, pleasant, became agitated briefly                 Home Living Family/patient expects to be discharged to:: Assisted living  Home Equipment: Walker - 2 wheels          Prior Functioning/Environment Level of Independence: Independent with assistive device(s);Needs assistance  Gait / Transfers Assistance Needed: Ambulates with RW ADL's / Homemaking Assistance Needed: Facility provides meals and housekeeping.  Will help with ADL's if needed per neice.        OT Diagnosis: Generalized weakness;Cognitive deficits;Acute pain   OT  Problem List: Decreased strength;Impaired balance (sitting and/or standing);Decreased cognition;Pain;Decreased activity tolerance;Decreased knowledge of use of DME or AE   OT Treatment/Interventions: Self-care/ADL training;DME and/or AE instruction;Therapeutic activities;Patient/family education;Therapeutic exercise    OT Goals(Current goals can be found in the care plan section) Acute Rehab OT Goals Patient Stated Goal: none stated OT Goal Formulation: With patient Time For Goal Achievement: 05/10/16 Potential to Achieve Goals: Good ADL Goals Pt Will Perform Grooming: with min guard assist;standing;with caregiver independent in assisting Pt Will Perform Upper Body Bathing: with supervision;with set-up;with caregiver independent in assisting;sitting Pt Will Perform Lower Body Bathing: with min assist;with min guard assist;with caregiver independent in assisting Pt Will Perform Upper Body Dressing: with supervision;with set-up;with caregiver independent in assisting Pt Will Perform Lower Body Dressing: with min assist;with caregiver independent in assisting Pt Will Transfer to Toilet: with min assist;with min guard assist;regular height toilet;ambulating;grab bars Pt Will Perform Toileting - Clothing Manipulation and hygiene: with min assist;sit to/from stand Pt Will Perform Tub/Shower Transfer: shower seat;3 in 1;with caregiver independent in assisting;with min assist;with min guard assist  OT Frequency: Min 2X/week   Barriers to D/C:    no barriers                     End of Session Equipment Utilized During Treatment: Gait belt;Rolling walker;Other (comment) (BSC)  Activity Tolerance: Patient limited by fatigue Patient left: in chair;with call bell/phone within reach;with bed alarm set   Time: 1001-1025 OT Time Calculation (min): 24 min Charges:  OT General Charges $OT Visit: 1 Procedure OT Evaluation $OT Eval Moderate Complexity: 1 Procedure OT Treatments $Therapeutic  Activity: 8-22 mins G-Codes:    Galen Manila 05/03/2016, 11:11 AM

## 2016-05-03 NOTE — Consult Note (Signed)
Cardiology Consult    Patient ID: Stephanie Colon MRN: [id number], DOB/AGE: 80-Mar-1920   Admit date: 04/30/2016 Date of Consult: 05/03/2016  Primary Physician: Stephanie Boys, DO Reason for Consult: Atrial fib Primary Cardiologist: New Requesting Provider: Dr. Sharl Colon  Patient Profile  Stephanie Colon is a 82 year old female with a past medical history of Alzheimer's dementia, HTN, HLD, depression and chronic diastolic CHF. She presented to the ED on 04/30/16 with increased weakness and was diagnosed with a UTI. She developed atrial fibrillation, rate controlled and cardiology was consulted.   History of Present Illness  Stephanie Colon was admitted with acute kidney injury, creatinine was 2.39, which improved with IV hydration. She had been diagnosed with a UTI 3 days prior to admission. Developed atrial fibrillation and was started on Lovenox.   She has since converted to NSR spontaneously, without any beta blockers or anti-arrhythmics. She is confused at baseline so history is limited. She denies chest pain and palpitations.   Also, of note her QT interval was prolonged at 510.   Past Medical History   Past Medical History:  Diagnosis Date  . Alzheimer disease   . Anemia   . Hypertension   . Renal disorder     Past Surgical History:  Procedure Laterality Date  . ABDOMINAL HYSTERECTOMY    . APPENDECTOMY    . CHOLECYSTECTOMY    . Internal surgery fron chron  1999  . OVARIAN CYST REMOVAL       Allergies  Allergies  Allergen Reactions  . Aspirin Other (See Comments)    REACTION: weak blood vessels-per MD   . Citalopram Hydrobromide Other (See Comments)  . Fosinopril Sodium Other (See Comments)    REACTION: unknown    Inpatient Medications    . Chlorhexidine Gluconate Cloth  6 each Topical Q0600  . enoxaparin (LOVENOX) injection  50 mg Subcutaneous Q24H  . feeding supplement (ENSURE ENLIVE)  237 mL Oral TID BM  . losartan  25 mg Oral Daily  . mupirocin ointment  1 application  Nasal BID  . sodium chloride flush  3 mL Intravenous Q12H    Family History    Family History  Problem Relation Age of Onset  . Cancer Mother 74    Ovarian  . Heart disease Brother   . Friedreich's ataxia Brother   . Hypertension Other     other relative  . Stroke Other     other relative  . Diabetes Other     other relative    Social History    Social History   Social History  . Marital status: Widowed    Spouse name: N/A  . Number of children: N/A  . Years of education: N/A   Occupational History  . Not on file.   Social History Main Topics  . Smoking status: Former Smoker    Packs/day: 1.00    Years: 50.00  . Smokeless tobacco: Never Used     Comment: Quit about age 56, Widowed, lives alone in her own home-drives and indep ADLs. Supportive neice Stephanie Colon who assist pt as needed  . Alcohol use No  . Drug use: No  . Sexual activity: No   Other Topics Concern  . Not on file   Social History Narrative   Diet:Regular   Do you drink/eat things with caffeine? No   Marital status:  Widowed  What year were you married? 1952   Do you live in a house, apartment, assisted living, condo, trailer, etc)? House   Is it one or more stories? 1   How many persons live in your home?1   Do you have any pets in your home? 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  No                                            Type & how often:   Do you have a living will? Yes   Do you have a DNR Form? Yes   Do you have a POA/HPOA forms? Yes        Review of Systems    General:  No chills, fever, night sweats or weight changes.  Cardiovascular:  No chest pain, dyspnea on exertion, edema, orthopnea, palpitations, paroxysmal nocturnal dyspnea. Dermatological: No rash, lesions/masses Respiratory: No cough, dyspnea Urologic: No hematuria, dysuria Abdominal:   No nausea, vomiting, diarrhea, bright red blood per rectum, melena,  or hematemesis Neurologic:  No visual changes, wkns, changes in mental status. All other systems reviewed and are otherwise negative except as noted above.  Physical Exam    Blood pressure (!) 146/58, pulse 81, temperature 98.2 F (36.8 C), temperature source Oral, resp. rate 18, height 5\' 7"  (1.702 m), weight 115 lb 1.3 oz (52.2 kg), SpO2 99 %.  General: Pleasant, NAD Psych: Normal affect. Neuro: Confused, alert only to self. Moves all extremities spontaneously. HEENT: Normal  Neck: Supple without bruits or JVD. Lungs:  Resp regular and unlabored, CTA. Heart: RRR no s3, s4, or murmurs. Abdomen: Soft, non-tender, non-distended, BS + x 4.  Extremities: No clubbing, cyanosis or edema. DP/PT/Radials 2+ and equal bilaterally.  Labs    Troponin [facility](Point of Care Test)  Recent Labs  04/30/16 1535  TROPIPOC 0.05    Recent Labs  04/30/16 1952 05/01/16 0139 05/01/16 0538 05/01/16 1135  CKTOTAL 104  --   --   --   TROPONINI  --  0.05* 0.05* 0.05*   Lab Results  Component Value Date   WBC 7.0 05/03/2016   HGB 8.2 (L) 05/03/2016   HCT 26.2 (L) 05/03/2016   MCV 89.1 05/03/2016   PLT 410 (H) 05/03/2016    Recent Labs Lab 05/01/16 0538 05/03/16 0635  NA 136 138  K 2.4* 4.8  CL 103 114*  CO2 23 20*  BUN 23* 9  CREATININE 1.83* 1.06*  CALCIUM 8.0* 8.4*  PROT 5.4*  --   BILITOT 0.5  --   ALKPHOS 52  --   ALT 11*  --   AST 22  --   GLUCOSE 75 78   Lab Results  Component Value Date   CHOL 203 (H) 05/28/2015   HDL 44 05/28/2015   LDLCALC 87 05/28/2015   TRIG 359 (H) 05/28/2015   [facility] results found for: [facility]   Radiology Studies    Dg Chest 2 View  Result Date: 04/30/2016 CLINICAL DATA:  New onset atrial fibrillation an weakness. Duration of symptoms 3 days. EXAM: CHEST  2 VIEW COMPARISON:  07/01/2015 FINDINGS: Heart size is normal. Aortic atherosclerosis. Mediastinal shadows are otherwise normal. There is chronic scarring at the left lung base. These markings are  presumed to represent chronic markings, but mild atelectasis or infiltrate at the left base  is not excluded. No effusions. Arterial calcification is noted. No acute bone finding. IMPRESSION: Aortic atherosclerosis. Left base scarring. Cannot rule out some active atelectasis or infiltrate at the left base. Electronically Signed   By: Paulina Fusi M.D.   On: 04/30/2016 15:20    EKG & Cardiac Imaging    EKG: NSR with RBBB  Echocardiogram: 05/02/16 Study Conclusions  - Left ventricle: The cavity size was normal. Wall thickness was   normal. Systolic function was normal. The estimated ejection   fraction was in the range of 60% to 65%. Wall motion was normal;   there were no regional wall motion abnormalities. Features are   consistent with a pseudonormal left ventricular filling pattern,   with concomitant abnormal relaxation and increased filling   pressure (grade 2 diastolic dysfunction). - Mitral valve: There was mild regurgitation. - Pulmonary arteries: Systolic pressure was mildly increased. PA   peak pressure: 31 mm Hg (S).  Assessment & Plan  1. Atrial fibrillation: Currently in NSR, converted spontaneously without medications. Likely due to acute infection with UTI.   This patients CHA2DS2-VASc Score and unadjusted Ischemic Stroke Rate (% per year) is equal to 4.8 % stroke rate/year from a score of 4 Above score calculated as 1 point each if present [CHF, HTN, DM, Vascular=MI/PAD/Aortic Plaque, Age if 65-74, or Female], 2 points each if present [Age > 75, or Stroke/TIA/TE]  Patient with CHA2DS2-VASc score of 4, currently on Lovenox. She lives at [facility] assisted living, and they manage her medications.Can add NOAC however, I suspect that her Afib was related to acute infection. Her left atrium size was normal. Her hemoglobin is 8.2 today, down from 11.9 10 months ago. Will defer decision regarding anticoagulation to MD. No history of recent falls. I spoke with her niece on the  phone who looks after her.   Can add beta blocker as she is hypertensive.   2. HTN: Would favor adding beta blocker - 12.5mg  Metoprolol BID.     Signed, Little Ishikawa, NP 05/03/2016, 11:59 AM Pager: [phone]

## 2016-05-03 NOTE — Care Management Important Message (Signed)
Important Message  Patient Details  Name: Stephanie Colon MRN: [id number] Date of Birth: May 28, 1919   Medicare Important Message Given:  Yes    Rachit Grim, Annamarie MajorCheryl U, RN 05/03/2016, 12:17 PM

## 2016-05-03 NOTE — Progress Notes (Signed)
Physical Therapy Treatment Patient Details Name: LANNY DONOSO MRN: [id number] DOB: February 15, 1919 Today's Date: 05/03/2016    History of Present Illness Patient is a 82 yo female admitted 04/30/16 with weakness, dehydration, increased confusion.  Patient wtih AKI, UTI, Afib.   PMH:  Alzheimer's dementia, anemia, HTN, CKD, anxiety, depression, CHF    PT Comments    Making progress with functional mobility; I believe dc back to ALF with familiar environment, caregivers, and routine will most benefit Ms. Christell Constant; can we verify that she will have 24 hour assist?  Follow Up Recommendations  Home health PT;Supervision/Assistance - 24 hour     Equipment Recommendations  Wheelchair (measurements PT);Wheelchair cushion (measurements PT)    Recommendations for Other Services       Precautions / Restrictions Precautions Precautions: Fall    Mobility  Bed Mobility Overal bed mobility: Needs Assistance Bed Mobility: Supine to Sit     Supine to sit: Min assist     General bed mobility comments: Min assist to pull to sit  Transfers Overall transfer level: Needs assistance Equipment used: Rolling walker (2 wheeled) Transfers: Sit to/from Stand Sit to Stand: Mod assist         General transfer comment: light mod assist to power up; cues for hand pleacement and safety  Ambulation/Gait Ambulation/Gait assistance: Min assist Ambulation Distance (Feet): 100 Feet Assistive device: Rolling walker (2 wheeled) Gait Pattern/deviations: Step-through pattern;Decreased step length - right;Decreased step length - left;Decreased stride length Gait velocity: decreased   General Gait Details: Verbal cues to stand upright and look forward during gait.  Patient takes short, shuffling steps.  Assist for balance and to maneuver RW in turns.   Stairs            Wheelchair Mobility    Modified Rankin (Stroke Patients Only)       Balance     Sitting balance-Leahy Scale: Fair        Standing balance-Leahy Scale: Poor                      Cognition Arousal/Alertness: Awake/alert Behavior During Therapy: WFL for tasks assessed/performed;Anxious;Agitated Overall Cognitive Status: History of cognitive impairments - at baseline       Memory: Decreased short-term memory              Exercises      General Comments        Pertinent Vitals/Pain Pain Assessment: Faces Faces Pain Scale: Hurts a little bit Pain Location: bil feet Pain Descriptors / Indicators: Tender Pain Intervention(s): Monitored during session    Home Living                      Prior Function            PT Goals (current goals can now be found in the care plan section) Acute Rehab PT Goals Patient Stated Goal: none stated PT Goal Formulation: With patient/family Time For Goal Achievement: 05/08/16 Potential to Achieve Goals: Fair Progress towards PT goals: Progressing toward goals    Frequency  Min 3X/week    PT Plan Current plan remains appropriate    Co-evaluation             End of Session Equipment Utilized During Treatment: Gait belt Activity Tolerance: Patient tolerated treatment well Patient left: in chair;with call bell/phone within reach;with chair alarm set;with family/visitor present     Time: 8295-6213 PT Time Calculation (min) (ACUTE ONLY): 22 min  Charges:  $Gait Training: 8-22 mins                    G Codes:      Olen PelGarrigan, Hadley Soileau Hamff 05/03/2016, 4:29 PM  Van ClinesHolly Hafiz Irion, [place]PT  Acute Rehabilitation Services Pager [phone] Office [phone]

## 2016-05-03 NOTE — Progress Notes (Signed)
ANTICOAGULATION CONSULT NOTE  Pharmacy Consult for Lovenox Indication: atrial fibrillation  Allergies  Allergen Reactions  . Aspirin Other (See Comments)    REACTION: weak blood vessels-per MD   . Citalopram Hydrobromide Other (See Comments)  . Fosinopril Sodium Other (See Comments)    REACTION: unknown    Patient Measurements: Height: 5\' 7"  (170.2 cm) Weight: 115 lb 1.3 oz (52.2 kg) IBW/kg (Calculated) : 61.6    Vital Signs: Temp: 98.2 F (36.8 C) (09/11 0959) Temp Source: Oral (09/11 0959) BP: 146/58 (09/11 0959) Pulse Rate: 81 (09/11 0959)  Labs:  Recent Labs  04/30/16 1446  04/30/16 1449 04/30/16 1952 05/01/16 0139 05/01/16 0538 05/01/16 1135 05/03/16 0635  HGB  --   < > 10.1*  --   --  9.3*  --  8.2*  HCT  --   --  31.0*  --   --  29.0*  --  26.2*  PLT  --   --  564*  --   --  478*  --  410*  CREATININE 2.39*  --   --   --   --  1.83*  --  1.06*  CKTOTAL  --   --   --  104  --   --   --   --   TROPONINI  --   --   --   --  0.05* 0.05* 0.05*  --   < > = values in this interval not displayed.  Estimated Creatinine Clearance: 26.2 mL/min (by C-G formula based on SCr of 1.06 mg/dL).   Assessment: 82 yo female with new onset AFib started on Lovenox.   AKI has resolved, baseline Scr 1.1 - 1.3, SCr down to 1.06 this morning. CBC overall stable, but some decline. No bleeding noted.  Goal of Therapy:  Anti-Xa level 0.6-1 units/ml 4hrs after LMWH dose given Monitor platelets by anticoagulation protocol: Yes   Plan:  -Lovenox 50 mg subQ q24h -CBC q72h minimum -Monitor renal fx, transition to PO anticoagulation   Rosalynn Sergent D. Trasean Delima, PharmD, BCPS Clinical Pharmacist Pager: [phone] 05/03/2016 11:36 AM

## 2016-05-04 LAB — BASIC METABOLIC PANEL
Anion gap: 6 (ref 5–15)
BUN: 8 mg/dL (ref 6–20)
CALCIUM: 8.6 mg/dL — AB (ref 8.9–10.3)
CO2: 21 mmol/L — ABNORMAL LOW (ref 22–32)
CREATININE: 1.06 mg/dL — AB (ref 0.44–1.00)
Chloride: 109 mmol/L (ref 101–111)
GFR calc non Af Amer: 43 mL/min — ABNORMAL LOW (ref >60)
GFR, EST AFRICAN AMERICAN: 50 mL/min — AB (ref >60)
Glucose, Bld: 82 mg/dL (ref 65–99)
Potassium: 4.6 mmol/L (ref 3.5–5.1)
SODIUM: 136 mmol/L (ref 135–145)

## 2016-05-04 LAB — PHOSPHORUS: PHOSPHORUS: 1.8 mg/dL — AB (ref 2.5–4.6)

## 2016-05-04 LAB — MAGNESIUM: MAGNESIUM: 1.5 mg/dL — AB (ref 1.7–2.4)

## 2016-05-04 MED ORDER — MAGNESIUM SULFATE 2 GM/50ML IV SOLN
2.0000 g | Freq: Once | INTRAVENOUS | Status: AC
  Administered 2016-05-04: 2 g via INTRAVENOUS
  Filled 2016-05-04: qty 50

## 2016-05-04 MED ORDER — VANCOMYCIN 50 MG/ML ORAL SOLUTION
125.0000 mg | Freq: Four times a day (QID) | ORAL | Status: DC
  Administered 2016-05-04 – 2016-05-06 (×8): 125 mg via ORAL
  Filled 2016-05-04 (×10): qty 2.5

## 2016-05-04 NOTE — Progress Notes (Addendum)
Pharmacy Antibiotic Note  Stephanie Colon is a 82 y.o. female admitted on 04/30/2016 with weakness.  [facility].  Stool sample sent as patient has been having diarrhea, but no abdominal pain. CDiff antigen was positive, toxin negative and PCR positive- recommend treatment since patient is having symptoms.  Plan: -PO vancomycin 125mg  q6h -Recommend 10 day course of treatment -Pharmacy to sign off and follow peripherally  Height: 5\' 7"  (170.2 cm) Weight: 132 lb 0.9 oz (59.9 kg) IBW/kg (Calculated) : 61.6  Temp (24hrs), Avg:98.5 F (36.9 C), Min:98.1 F (36.7 C), Max:98.8 F (37.1 C)   Recent Labs Lab 04/30/16 1446 04/30/16 1449 04/30/16 1457 04/30/16 1818 05/01/16 0139 05/01/16 0301 05/01/16 0538 05/03/16 0635 05/04/16 0434  WBC  --  7.6  --   --   --   --  7.0 7.0  --   CREATININE 2.39*  --   --   --   --   --  1.83* 1.06* 1.06*  LATICACIDVEN  --   --  3.36* 2.14* 1.2 1.0  --   --   --     Estimated Creatinine Clearance: 30 mL/min (by C-G formula based on SCr of 1.06 mg/dL).    Allergies  Allergen Reactions  . Aspirin Other (See Comments)    REACTION: weak blood vessels-per MD   . Citalopram Hydrobromide Other (See Comments)  . Fosinopril Sodium Other (See Comments)    REACTION: unknown    Antimicrobials this admission: ( 5 days of Bactrim PTA) CTX 9/9 >> 9/10 PO vanc 9/12>>  Dose adjustments this admission: n/a  Microbiology results: 9/8 urine: mult species 9/8 BCx: ngtd 9/11 CDiff: antigen pos, toxin neg; PCR positive  Thank you for allowing pharmacy to be a part of this patient's care.  Ameila Weldon D. Angelys Yetman, PharmD, BCPS Clinical Pharmacist Pager: [phone] 05/04/2016 1:21 PM

## 2016-05-04 NOTE — Progress Notes (Signed)
[facility]  PROGRESS NOTE  Stephanie Colon ZOX:[id number] DOB: [birth year] DOA: 04/30/2016 PCP: Kirt Boys, DO    Brief HPI:   82 y.o. female with medical history significant of Alzheimer's dementia, hypertension, hyperlipidemia, depression, anxiety, L1 superior endplate compression chronic diastolic CHF   Presented with overall generalized fatigue for the past few days. 5 days ago patient was diagnosed with UTI and started on Bactrim but she's been getting progressively weak and hasn't been eating well and refusing to get out of bed    Assessment/Plan:    1. AKI- Patient presented with acute kidney injury with creatinine 2.39. Start IV fluids and creatinine is down to 1.06. Will continue with IV fluids. 2. Hypokalemia - potassium was 2.4,Replaced the potassium is 4.8 3. Diarrhea- C. difficile PCR positive, discuss with ID. At this time will treat patient as if C. difficile positive, and assess clinical response on by mouth vancomycin. 4. Hypomagnesemia- magnesium was 1.2. Replaced magnesium , magnesium 1.5. Replace magnesium and check mag level in a.m. 5.  New onset atrial fibrillation -patient has new onset atrial fibrillation, heart rate is controlled.patient was initially started on Lovenox which has been discontinued at this time, cardiology has signed off patient not a candidate for anticoagulation as the risks of bleeding far outweighs benefits. 6.  UTI- patient has been on Septra for past 3 days and was recently switched to Cipro. UA in the hospital is negative. Patient empirically started on ceftriaxone. Ceftriaxone has been discontinued. 7.  Prolonged QT interval- QTc interval is 510, replace magnesium. Will observe.    DVT prophylaxis:Lovenox Code Status: DNR Family Communication: No family at bedside Disposition Plan: Skilled nursing facility   Consultants:  Cardiology  Procedures:  None   Antibiotics:  None   Subjective   Patient seen and examined,  Had 5 episodes of loose bowel movements and past 24 hours. Stool for C. difficile PCR obtained, positive for C. difficile antigen negative for toxin.  Objective    Objective: Vitals:   05/03/16 1707 05/03/16 2024 05/04/16 0409 05/04/16 0947  BP: (!) 169/64 (!) 153/60 (!) 155/63 127/74  Pulse: 84 81 76 74  Resp: 17 17 17    Temp: 98.1 F (36.7 C) 98.4 F (36.9 C) 98.8 F (37.1 C) 98.5 F (36.9 C)  TempSrc: Oral Oral Oral Oral  SpO2: 99% 94% 95% 98%  Weight:  59.9 kg (132 lb 0.9 oz)    Height:        Intake/Output Summary (Last 24 hours) at 05/04/16 1617 Last data filed at 05/04/16 1100  Gross per 24 hour  Intake           758.67 ml  Output              100 ml  Net           658.67 ml   Filed Weights   04/30/16 1435 04/30/16 2354 05/03/16 2024  Weight: 52.2 kg (115 lb) 52.2 kg (115 lb 1.3 oz) 59.9 kg (132 lb 0.9 oz)    Examination:  General exam: Appears calm and comfortable  Respiratory system: Clear to auscultation. Respiratory effort normal. Cardiovascular system: S1 & S2 heard, irregular. No JVD, murmurs, rubs, gallops or clicks. No pedal edema. Gastrointestinal system: Abdomen is nondistended, soft and nontender. No organomegaly or masses felt. Normal bowel sounds heard. Central nervous system: Alert and oriented. No focal neurological deficits. Extremities: Symmetric 5 x 5 power. Skin: No rashes, lesions or ulcers Psychiatry: Judgement and insight appear normal.  Mood & affect appropriate.    Data Reviewed: I have personally reviewed following labs and imaging studies Basic Metabolic Panel:  Recent Labs Lab 04/30/16 1446 05/01/16 0538 05/03/16 0635 05/04/16 0434  NA 131* 136 138 136  K 3.1* 2.4* 4.8 4.6  CL 97* 103 114* 109  CO2 22 23 20* 21*  GLUCOSE 129* 75 78 82  BUN 31* 23* 9 8  CREATININE 2.39* 1.83* 1.06* 1.06*  CALCIUM 8.1* 8.0* 8.4* 8.6*  MG  --  1.2*  --  1.5*  PHOS  --  1.6*  --  1.8*   Liver Function Tests:  Recent Labs Lab  05/01/16 0538  AST 22  ALT 11*  ALKPHOS 52  BILITOT 0.5  PROT 5.4*  ALBUMIN 1.9*   No results for input(s): LIPASE, AMYLASE in the last 168 hours. No results for input(s): AMMONIA in the last 168 hours. CBC:  Recent Labs Lab 04/30/16 1449 05/01/16 0538 05/03/16 0635  WBC 7.6 7.0 7.0  NEUTROABS 5.9  --   --   HGB 10.1* 9.3* 8.2*  HCT 31.0* 29.0* 26.2*  MCV 86.6 86.1 89.1  PLT 564* 478* 410*   Cardiac Enzymes:  Recent Labs Lab 04/30/16 1952 05/01/16 0139 05/01/16 0538 05/01/16 1135  CKTOTAL 104  --   --   --   TROPONINI  --  0.05* 0.05* 0.05*   BNP (last 3 results) No results for input(s): BNP in the last 8760 hours.  ProBNP (last 3 results) No results for input(s): PROBNP in the last 8760 hours.  CBG:  Recent Labs Lab 04/30/16 1449  GLUCAP 138*    Recent Results (from the past 240 hour(s))  Culture, blood (Routine x 2)     Status: None (Preliminary result)   Collection Time: 04/30/16  3:30 PM  Result Value Ref Range Status   Specimen Description BLOOD RIGHT ANTECUBITAL  Final   Special Requests BOTTLES DRAWN AEROBIC ONLY  5CC  Final   Culture NO GROWTH 4 DAYS  Final   Report Status PENDING  Incomplete  Culture, blood (Routine x 2)     Status: None (Preliminary result)   Collection Time: 04/30/16  3:35 PM  Result Value Ref Range Status   Specimen Description BLOOD LEFT ANTECUBITAL  Final   Special Requests IN PEDIATRIC BOTTLE  3CC  Final   Culture NO GROWTH 4 DAYS  Final   Report Status PENDING  Incomplete  Urine culture     Status: Abnormal   Collection Time: 04/30/16  5:45 PM  Result Value Ref Range Status   Specimen Description URINE, RANDOM  Final   Special Requests NONE  Final   Culture MULTIPLE SPECIES PRESENT, SUGGEST RECOLLECTION (A)  Final   Report Status 05/02/2016 FINAL  Final  MRSA PCR Screening     Status: Abnormal   Collection Time: 05/01/16  6:08 PM  Result Value Ref Range Status   MRSA by PCR POSITIVE (A) NEGATIVE Final     Comment:        The GeneXpert MRSA Assay (FDA approved for NASAL specimens only), is one component of a comprehensive MRSA colonization surveillance program. It is not intended to diagnose MRSA infection nor to guide or monitor treatment for MRSA infections. RESULT CALLED TO, READ BACK BY AND VERIFIED WITH: RN Marilynn RailC WICKER AT 1946 [id number] MARTINB   C difficile quick scan w PCR reflex     Status: Abnormal   Collection Time: 05/03/16  2:16 PM  Result Value Ref Range Status  C Diff antigen POSITIVE (A) NEGATIVE Final   C Diff toxin NEGATIVE NEGATIVE Final   C Diff interpretation Results are indeterminate. See PCR results.  Final  Clostridium Difficile by PCR     Status: Abnormal   Collection Time: 05/03/16  2:16 PM  Result Value Ref Range Status   Toxigenic C Difficile by pcr POSITIVE (A) NEGATIVE Final    Comment: Positive for toxigenic C. difficile with little to no toxin production. Only treat if clinical presentation suggests symptomatic illness. CRITICAL RESULT CALLED TO, READ BACK BY AND VERIFIED WITH: Marilynn Rail, RN AT 1940 ON 05/03/16 BY C. JESSUP, MLT.      Studies: No results found.  Scheduled Meds: . Chlorhexidine Gluconate Cloth  6 each Topical Q0600  . feeding supplement (ENSURE ENLIVE)  237 mL Oral TID BM  . metoprolol tartrate  12.5 mg Oral BID  . mupirocin ointment  1 application Nasal BID  . sodium chloride flush  3 mL Intravenous Q12H  . vancomycin  125 mg Oral Q6H   Continuous Infusions: . sodium chloride 20 mL/hr at 05/03/16 1334       Time spent: 25 min    [facility]  [facility] Pager [phone]. If 7PM-7AM, please contact night-coverage at [email or web address], Office  [phone]  password [email or web address] 05/04/2016, 4:17 PM  LOS: 4 days

## 2016-05-04 NOTE — Progress Notes (Signed)
Occupational Therapy Treatment Patient Details Name: Stephanie Colon MRN: [id number] DOB: 23-Sep-1918 Today's Date: 05/04/2016    History of present illness Patient is a 82 yo female admitted 04/30/16 with weakness, dehydration, increased confusion.  Patient wtih AKI, UTI, Afib.   PMH:  Alzheimer's dementia, anemia, HTN, CKD, anxiety, depression, CHF   OT comments  Pt progressing slowly, but steadily towards OT goals. Pt able to complete basic transfers with min guard assist and ADLs with min assist. Pt continues to require cues for sequencing through ADL tasks and for safety throughout session. Current POC and discharge disposition remain appropriate. Will continue to follow acutely.   Follow Up Recommendations  Home health OT;Supervision/Assistance - 24 hour    Equipment Recommendations  None recommended by OT    Recommendations for Other Services      Precautions / Restrictions Precautions Precautions: Fall Restrictions Weight Bearing Restrictions: No       Mobility Bed Mobility Overal bed mobility: Needs Assistance Bed Mobility: Supine to Sit;Sit to Supine     Supine to sit: Min guard Sit to supine: Min guard   General bed mobility comments: Min guard assist for safety.   Transfers Overall transfer level: Needs assistance Equipment used: Rolling walker (2 wheeled) Transfers: Sit to/from Stand Sit to Stand: Min assist         General transfer comment: Min guard assist for safety. VCs for hand placement.    Balance Overall balance assessment: Needs assistance Sitting-balance support: No upper extremity supported;Feet supported Sitting balance-Leahy Scale: Fair     Standing balance support: Bilateral upper extremity supported;During functional activity Standing balance-Leahy Scale: Poor                     ADL Overall ADL's : Needs assistance/impaired     Grooming: Wash/dry hands;Wash/dry face;Min Civil engineer, contracting: Minimal assistance;Ambulation;Regular Toilet;RW;Cueing for safety   Toileting- Clothing Manipulation and Hygiene: Minimal assistance;Sit to/from stand       Functional mobility during ADLs: Minimal assistance;Rolling walker        Vision                     Perception     Praxis      Cognition   Behavior During Therapy: [facility] for tasks assessed/performed;Anxious;Agitated Overall Cognitive Status: History of cognitive impairments - at baseline       Memory: Decreased short-term memory               Extremity/Trunk Assessment               Exercises     Shoulder Instructions       General Comments      Pertinent Vitals/ Pain       Pain Assessment: Faces Faces Pain Scale: Hurts a little bit Pain Location: back Pain Descriptors / Indicators: Aching Pain Intervention(s): Monitored during session;Repositioned  Home Living                                          Prior Functioning/Environment              Frequency       Progress Toward Goals  OT Goals(current goals can now be found in the care plan section)  Progress  towards OT goals: Progressing toward goals  Acute Rehab OT Goals Patient Stated Goal: none stated OT Goal Formulation: With patient Time For Goal Achievement: 05/10/16 Potential to Achieve Goals: Good ADL Goals Pt Will Perform Grooming: with min guard assist;standing;with caregiver independent in assisting Pt Will Perform Upper Body Bathing: with supervision;with set-up;with caregiver independent in assisting;sitting Pt Will Perform Lower Body Bathing: with min assist;with min guard assist;with caregiver independent in assisting Pt Will Perform Upper Body Dressing: with supervision;with set-up;with caregiver independent in assisting Pt Will Perform Lower Body Dressing: with min assist;with caregiver independent in assisting Pt Will Transfer to Toilet: with min assist;with min guard  assist;regular height toilet;ambulating;grab bars Pt Will Perform Toileting - Clothing Manipulation and hygiene: with min assist;sit to/from stand Pt Will Perform Tub/Shower Transfer: shower seat;3 in 1;with caregiver independent in assisting;with min assist;with min guard assist  Plan Discharge plan remains appropriate    Co-evaluation                 End of Session Equipment Utilized During Treatment: Gait belt;Rolling walker   Activity Tolerance Patient tolerated treatment well   Patient Left in bed;with call bell/phone within reach;with bed alarm set   Nurse Communication Mobility status        Time: [id number] OT Time Calculation (min): 19 min  Charges: OT General Charges $OT Visit: 1 Procedure OT Treatments $Self Care/Home Management : 8-22 mins  Nils PyleJulia Kesean Serviss, OTR/L Pager: [phone] 05/04/2016, 2:26 PM

## 2016-05-05 DIAGNOSIS — A047 Enterocolitis due to Clostridium difficile: Secondary | ICD-10-CM

## 2016-05-05 LAB — CULTURE, BLOOD (ROUTINE X 2)
Culture: NO GROWTH
Culture: NO GROWTH

## 2016-05-05 LAB — MAGNESIUM: Magnesium: 1.7 mg/dL (ref 1.7–2.4)

## 2016-05-05 NOTE — Progress Notes (Signed)
Physical Therapy Treatment Patient Details Name: Stephanie Colon MRN: [id number] DOB: 11-Jun-1919 Today's Date: 05/05/2016    History of Present Illness Patient is a 82 yo female admitted 04/30/16 with weakness, dehydration, increased confusion.  Patient wtih AKI, UTI, Afib.   PMH:  Alzheimer's dementia, anemia, HTN, CKD, anxiety, depression, CHF    PT Comments    Needing encouragement to participate, but still walked in  the hallway this afternoon; Noting that the plan is for her to dc back to ALF, we will need to verify the level of assist that staff can provide; at this point she is needing min to minguard assist for transfers and when she is standing   Follow Up Recommendations  Home health PT;Supervision/Assistance - 24 hour     Equipment Recommendations  Wheelchair (measurements PT);Wheelchair cushion (measurements PT)    Recommendations for Other Services       Precautions / Restrictions Precautions Precautions: Fall    Mobility  Bed Mobility Overal bed mobility: Needs Assistance Bed Mobility: Supine to Sit;Sit to Supine     Supine to sit: Min guard Sit to supine: Min assist   General bed mobility comments: Min guard assist for safety.   Transfers Overall transfer level: Needs assistance Equipment used: Rolling walker (2 wheeled) Transfers: Sit to/from Stand Sit to Stand: Mod assist         General transfer comment: Light mod assist to power up. VCs for hand placement.  Ambulation/Gait Ambulation/Gait assistance: Min guard Ambulation Distance (Feet): 100 Feet Assistive device: Rolling walker (2 wheeled) Gait Pattern/deviations: Step-through pattern Gait velocity: decreased   General Gait Details: Verbal cues to stand upright and look forward during gait.  Patient takes short, shuffling steps.  Assist for balance and to maneuver RW in turns.   Stairs            Wheelchair Mobility    Modified Rankin (Stroke Patients Only)       Balance      Sitting balance-Leahy Scale: Fair       Standing balance-Leahy Scale: Poor                      Cognition Arousal/Alertness: Awake/alert Behavior During Therapy: WFL for tasks assessed/performed;Anxious;Agitated Overall Cognitive Status: History of cognitive impairments - at baseline       Memory: Decreased short-term memory              Exercises      General Comments        Pertinent Vitals/Pain Pain Assessment: Faces Faces Pain Scale: Hurts little more Pain Location: soreness in legs Pain Descriptors / Indicators: Sore Pain Intervention(s): Monitored during session;Repositioned    Home Living                      Prior Function            PT Goals (current goals can now be found in the care plan section) Acute Rehab PT Goals Patient Stated Goal: none stated PT Goal Formulation: With patient/family Time For Goal Achievement: 05/08/16 Potential to Achieve Goals: Fair Progress towards PT goals: Progressing toward goals    Frequency  Min 3X/week    PT Plan Current plan remains appropriate    Co-evaluation             End of Session Equipment Utilized During Treatment: Gait belt Activity Tolerance: Patient tolerated treatment well Patient left: in bed;with call bell/phone within reach;with bed alarm set  Time: [id number] PT Time Calculation (min) (ACUTE ONLY): 15 min  Charges:  $Gait Training: 8-22 mins                    G Codes:      Olen PelGarrigan, Lonnie Reth Hamff 05/05/2016, 5:16 PM  Van ClinesHolly Vincenta Steffey, [place]PT  Acute Rehabilitation Services Pager [phone] Office [phone]

## 2016-05-05 NOTE — Progress Notes (Signed)
[facility]  PROGRESS NOTE  Stephanie Colon ZOX:[id number] DOB: [birth year] DOA: 04/30/2016 PCP: Kirt Boysarter, Monica, DO    Brief HPI:   82 y.o. female with medical history significant of Alzheimer's dementia, hypertension, hyperlipidemia, depression, anxiety, L1 superior endplate compression chronic diastolic CHF   Presented with overall generalized fatigue for the past few days. 5 days ago patient was diagnosed with UTI and started on Bactrim but she's been getting progressively weak and hasn't been eating well and refusing to get out of bed    Assessment/Plan:    1. AKI- Patient presented with acute kidney injury with creatinine 2.39. Start IV fluids and creatinine is down to 1.06. Will continue with IV fluids. 2. Hypokalemia - potassium was 2.4,Replaced the potassium is 4.8 3. Diarrhea- C. difficile PCR positive, on by mouth vancomycin. 4. Hypomagnesemia- magnesium was 1.2. Replaced magnesium , magnesium 1.5. Replace magnesium and check mag level in a.m. 5.  New onset atrial fibrillation -patient has new onset atrial fibrillation, heart rate is controlled.patient was initially started on Lovenox which has been discontinued at this time, cardiology has signed off patient not a candidate for anticoagulation as the risks of bleeding far outweighs benefits. 6.  UTI- patient has been on Septra for past 3 days and was recently switched to Cipro. UA in the hospital is negative. Patient empirically started on ceftriaxone. Ceftriaxone has been discontinued. 7.  Prolonged QT interval- QTc interval is 510, replace magnesium. Will observe.    DVT prophylaxis:Lovenox Code Status: DNR Family Communication: No family at bedside Disposition Plan: Skilled nursing facility   Consultants:  Cardiology  Procedures:  None   Antibiotics:  None   Subjective   Patient seen and examined, less diarrhea today  Objective    Objective: Vitals:   05/04/16 0947 05/04/16 1835 05/05/16 0621  05/05/16 1000  BP: 127/74 (!) 160/80 (!) 164/56 126/72  Pulse: 74 75 76 75  Resp:  18 18 18   Temp: 98.5 F (36.9 C) 97.7 F (36.5 C) 98.4 F (36.9 C) 98 F (36.7 C)  TempSrc: Oral Oral Oral Oral  SpO2: 98% 99% 96% 98%  Weight:      Height:        Intake/Output Summary (Last 24 hours) at 05/05/16 1516 Last data filed at 05/05/16 0905  Gross per 24 hour  Intake              840 ml  Output                1 ml  Net              839 ml   Filed Weights   04/30/16 1435 04/30/16 2354 05/03/16 2024  Weight: 52.2 kg (115 lb) 52.2 kg (115 lb 1.3 oz) 59.9 kg (132 lb 0.9 oz)    Examination:  General exam: Appears calm and comfortable  Respiratory system: Clear to auscultation. Respiratory effort normal. Cardiovascular system: S1 & S2 heard, irregular. No JVD, murmurs, rubs, gallops or clicks. No pedal edema. Gastrointestinal system: Abdomen is nondistended, soft and nontender. No organomegaly or masses felt. Normal bowel sounds heard. Central nervous system: Alert and oriented. No focal neurological deficits. Extremities: Symmetric 5 x 5 power. Skin: No rashes, lesions or ulcers Psychiatry: Judgement and insight appear normal. Mood & affect appropriate.    Data Reviewed: I have personally reviewed following labs and imaging studies Basic Metabolic Panel:  Recent Labs Lab 04/30/16 1446 05/01/16 0538 05/03/16 0635 05/04/16 0434 05/05/16 0538  NA 131*  136 138 136  --   K 3.1* 2.4* 4.8 4.6  --   CL 97* 103 114* 109  --   CO2 22 23 20* 21*  --   GLUCOSE 129* 75 78 82  --   BUN 31* 23* 9 8  --   CREATININE 2.39* 1.83* 1.06* 1.06*  --   CALCIUM 8.1* 8.0* 8.4* 8.6*  --   MG  --  1.2*  --  1.5* 1.7  PHOS  --  1.6*  --  1.8*  --    Liver Function Tests:  Recent Labs Lab 05/01/16 0538  AST 22  ALT 11*  ALKPHOS 52  BILITOT 0.5  PROT 5.4*  ALBUMIN 1.9*   No results for input(s): LIPASE, AMYLASE in the last 168 hours. No results for input(s): AMMONIA in the last 168  hours. CBC:  Recent Labs Lab 04/30/16 1449 05/01/16 0538 05/03/16 0635  WBC 7.6 7.0 7.0  NEUTROABS 5.9  --   --   HGB 10.1* 9.3* 8.2*  HCT 31.0* 29.0* 26.2*  MCV 86.6 86.1 89.1  PLT 564* 478* 410*   Cardiac Enzymes:  Recent Labs Lab 04/30/16 1952 05/01/16 0139 05/01/16 0538 05/01/16 1135  CKTOTAL 104  --   --   --   TROPONINI  --  0.05* 0.05* 0.05*   BNP (last 3 results) No results for input(s): BNP in the last 8760 hours.  ProBNP (last 3 results) No results for input(s): PROBNP in the last 8760 hours.  CBG:  Recent Labs Lab 04/30/16 1449  GLUCAP 138*    Recent Results (from the past 240 hour(s))  Culture, blood (Routine x 2)     Status: None (Preliminary result)   Collection Time: 04/30/16  3:30 PM  Result Value Ref Range Status   Specimen Description BLOOD RIGHT ANTECUBITAL  Final   Special Requests BOTTLES DRAWN AEROBIC ONLY  5CC  Final   Culture NO GROWTH 4 DAYS  Final   Report Status PENDING  Incomplete  Culture, blood (Routine x 2)     Status: None (Preliminary result)   Collection Time: 04/30/16  3:35 PM  Result Value Ref Range Status   Specimen Description BLOOD LEFT ANTECUBITAL  Final   Special Requests IN PEDIATRIC BOTTLE  3CC  Final   Culture NO GROWTH 4 DAYS  Final   Report Status PENDING  Incomplete  Urine culture     Status: Abnormal   Collection Time: 04/30/16  5:45 PM  Result Value Ref Range Status   Specimen Description URINE, RANDOM  Final   Special Requests NONE  Final   Culture MULTIPLE SPECIES PRESENT, SUGGEST RECOLLECTION (A)  Final   Report Status 05/02/2016 FINAL  Final  MRSA PCR Screening     Status: Abnormal   Collection Time: 05/01/16  6:08 PM  Result Value Ref Range Status   MRSA by PCR POSITIVE (A) NEGATIVE Final    Comment:        The GeneXpert MRSA Assay (FDA approved for NASAL specimens only), is one component of a comprehensive MRSA colonization surveillance program. It is not intended to diagnose MRSA infection  nor to guide or monitor treatment for MRSA infections. RESULT CALLED TO, READ BACK BY AND VERIFIED WITH: RN Marilynn Rail AT 1946 [id number] MARTINB   C difficile quick scan w PCR reflex     Status: Abnormal   Collection Time: 05/03/16  2:16 PM  Result Value Ref Range Status   C Diff antigen POSITIVE (A) NEGATIVE  Final   C Diff toxin NEGATIVE NEGATIVE Final   C Diff interpretation Results are indeterminate. See PCR results.  Final  Clostridium Difficile by PCR     Status: Abnormal   Collection Time: 05/03/16  2:16 PM  Result Value Ref Range Status   Toxigenic C Difficile by pcr POSITIVE (A) NEGATIVE Final    Comment: Positive for toxigenic C. difficile with little to no toxin production. Only treat if clinical presentation suggests symptomatic illness. CRITICAL RESULT CALLED TO, READ BACK BY AND VERIFIED WITH: Marilynn Rail, RN AT 1940 ON 05/03/16 BY C. JESSUP, MLT.      Studies: No results found.  Scheduled Meds: . feeding supplement (ENSURE ENLIVE)  237 mL Oral TID BM  . metoprolol tartrate  12.5 mg Oral BID  . mupirocin ointment  1 application Nasal BID  . sodium chloride flush  3 mL Intravenous Q12H  . vancomycin  125 mg Oral Q6H   Continuous Infusions: . sodium chloride 20 mL/hr at 05/03/16 1334         Ysidra Sopher MD [facility] Pager [phone].  If 7PM-7AM, please contact night-coverage at [email or web address], Office  [phone]  password [email or web address] 05/05/2016, 3:16 PM  LOS: 5 days

## 2016-05-05 NOTE — NC FL2 (Signed)
[place] MEDICAID FL2 LEVEL OF CARE SCREENING TOOL     IDENTIFICATION  Patient Name: Stephanie Colon Birthdate: [birth year] Sex: female Admission Date (Current Location): 04/30/2016  [facility] and [place] Number:  Producer, television/film/video and Address:  [facility], [address], [place], [place] [address]      Provider Number: [id number]  Attending Physician Name and Address:  Starleen Arms, MD  Relative Name and Phone Number:       Current Level of Care: Hospital Recommended Level of Care: Assisted Living Facility (with HHPT, HHOT) Prior Approval Number:    Date Approved/Denied:   PASRR Number:    Discharge Plan: Other (Comment) (ALF with HHPT/OT)    Current Diagnoses: Patient Active Problem List   Diagnosis Date Noted  . Protein-calorie malnutrition, severe 05/02/2016  . Dehydration 04/30/2016  . UTI (lower urinary tract infection) 04/30/2016  . AKI (acute kidney injury) (HCC) 04/30/2016  . Afib (HCC) 04/30/2016  . Palliative care encounter   . DNR (do not resuscitate) discussion   . Anxiety and depression 07/02/2015  . Hypercalcemia 07/02/2015  . Anemia of chronic disease 07/02/2015  . Elevated troponin 07/01/2015  . Dementia 07/01/2015  . Fall 07/01/2015  . Compression fracture of L1 lumbar vertebra (HCC): Probable mild 07/01/2015  . Elevated CK 07/01/2015  . CKD (chronic kidney disease) stage 3, GFR 30-59 ml/min 07/01/2015  . Essential hypertension 01/21/2010    Orientation RESPIRATION BLADDER Height & Weight     Self, Place  Normal Incontinent Weight: 132 lb 0.9 oz (59.9 kg) Height:  5\' 7"  (170.2 cm)  BEHAVIORAL SYMPTOMS/MOOD NEUROLOGICAL BOWEL NUTRITION STATUS   (None)  (Dementia) Incontinent Diet (Heart healthy)  AMBULATORY STATUS COMMUNICATION OF NEEDS Skin   Limited Assist Verbally Bruising                       Personal Care Assistance Level of Assistance  Bathing, Feeding, Dressing Bathing Assistance: Limited  assistance Feeding assistance: Limited assistance Dressing Assistance: Limited assistance     Functional Limitations Info  Sight, Hearing, Speech Sight Info: Adequate Hearing Info: Adequate Speech Info: Adequate    SPECIAL CARE FACTORS FREQUENCY  PT (By licensed PT), OT (By licensed OT), Blood pressure     PT Frequency: 3 x week OT Frequency: 3 x week            Contractures Contractures Info: Not present    Additional Factors Info  Code Status, Allergies, Isolation Precautions Code Status Info: DNR Allergies Info: Aspirin, CItalopram Hydrobromide, Fosinopril Sodium     Isolation Precautions Info: Contact and Enteric Precautions (UV Disinfection): MRSA     Current Medications (05/05/2016):  This is the current hospital active medication list Current Facility-Administered Medications  Medication Dose Route Frequency Provider Last Rate Last Dose  . 0.9 %  sodium chloride infusion   Intravenous Continuous Meredeth Ide, MD 20 mL/hr at 05/03/16 1334    . acetaminophen (TYLENOL) tablet 650 mg  650 mg Oral Q6H PRN Therisa Doyne, MD       Or  . acetaminophen (TYLENOL) suppository 650 mg  650 mg Rectal Q6H PRN Therisa Doyne, MD      . feeding supplement (ENSURE ENLIVE) (ENSURE ENLIVE) liquid 237 mL  237 mL Oral TID BM Meredeth Ide, MD   237 mL at 05/05/16 1000  . guaiFENesin (ROBITUSSIN) 100 MG/5ML solution 200 mg  200 mg Oral TID PRN Therisa Doyne, MD      .  HYDROcodone-acetaminophen (NORCO/VICODIN) 5-325 MG per tablet 1-2 tablet  1-2 tablet Oral Q4H PRN Therisa DoyneAnastassia Doutova, MD   2 tablet at 05/02/16 1529  . metoprolol tartrate (LOPRESSOR) tablet 12.5 mg  12.5 mg Oral BID Quintella Reichertraci R Turner, MD   12.5 mg at 05/05/16 1054  . mupirocin ointment (BACTROBAN) 2 % 1 application  1 application Nasal BID Meredeth IdeGagan S Lama, MD   1 application at 05/05/16 1055  . neomycin-bacitracin-polymyxin (NEOSPORIN) ointment 1 application  1 application Topical PRN Meredeth IdeGagan S Lama, MD      . sodium  chloride flush (NS) 0.9 % injection 3 mL  3 mL Intravenous Q12H Therisa DoyneAnastassia Doutova, MD   3 mL at 05/04/16 2108  . traMADol (ULTRAM) tablet 50 mg  50 mg Oral Q6H PRN Therisa DoyneAnastassia Doutova, MD   50 mg at 05/01/16 1138  . vancomycin (VANCOCIN) 50 mg/mL oral solution 125 mg  125 mg Oral Q6H Lauren D Bajbus, RPH   125 mg at 05/05/16 [id number]     Discharge Medications: Please see discharge summary for a list of discharge medications.  Relevant Imaging Results:  Relevant Lab Results:   Additional Information SS#: [id number]  Margarito LinerSarah C Ninetta Adelstein, LCSW

## 2016-05-05 NOTE — Clinical Social Work Note (Signed)
Clinical Social Work Assessment  Patient Details  Name: Stephanie Colon MRN: [id number] Date of Birth: January 28, 1919  Date of referral:  05/01/16               Reason for consult:  Discharge Planning                Permission sought to share information with:  Facility Medical sales representativeContact Representative, Family Supports Permission granted to share information::  Yes, Verbal Permission Granted  Name::     Stephanie AbrahamsJudy Colon  Agency::  [facility]  Relationship::  Niece  Contact Information:  [phone]  Housing/Transportation Living arrangements for the past 2 months:  Assisted Living Facility Source of Information:  Medical Team, Other (Comment Required) (Niece) Patient Interpreter Needed:  None Criminal Activity/Legal Involvement Pertinent to Current Situation/Hospitalization:  No - Comment as needed Significant Relationships:  Other Family Members Lives with:  Facility Resident Do you feel safe going back to the place where you live?  Yes Need for family participation in patient care:  Yes (Comment)  Care giving concerns:  [facility]   Social Worker assessment / plan:  Patient not fully oriented. CSW called patient's niece, Stephanie HongJudy, for assessment. CSW introduced role and explained that discharge planning would be discussed. Patient's niece confirmed [facility] and the plan is for her to return once stable for discharge. Patient's niece will transport and is usually here every afternoon. Patient's niece concerned that patient may be getting worse. No further concerns. CSW encouraged patient's niece to contact CSW as needed. CSW will continue to follow patient and her niece for support and facilitate discharge back to ALF once medically stable.  Employment status:  Retired Health and safety inspectornsurance information:  Medicare PT Recommendations:  Home with Home Health Information / Referral to community resources:  Other (Comment Required) (Returning to  ALF)  Patient/Family's Response to care:  Patient not fully oriented. Patient's niece agreeable to return to ALF with HHPT. Patient's niece supportive and involved in patient's care. Patient's niece appreciated social work intervention.  Patient/Family's Understanding of and Emotional Response to Diagnosis, Current Treatment, and Prognosis:  Patient not fully oriented. Patient's niece understands that patient will return to ALF with HHPT due to walking 100 feet min assist on Monday. Patient's niece concerned that patient may be getting worse. CSW ensured her that progress would be followed. Patient's niece appears happy with hospital care.  Emotional Assessment Appearance:  Appears stated age Attitude/Demeanor/Rapport:  Unable to Assess Affect (typically observed):  Unable to Assess Orientation:  Oriented to Self, Oriented to Place Alcohol / Substance use:  Never Used Psych involvement (Current and /or in the community):  No (Comment)  Discharge Needs  Concerns to be addressed:  Care Coordination Readmission within the last 30 days:  No Current discharge risk:  Cognitively Impaired, Dependent with Mobility Barriers to Discharge:  No Barriers Identified   Margarito LinerSarah C Adaja Wander, LCSW 05/05/2016, 11:07 AM

## 2016-05-05 NOTE — Progress Notes (Signed)
Called admission nurse for Pt.

## 2016-05-06 DIAGNOSIS — N390 Urinary tract infection, site not specified: Secondary | ICD-10-CM

## 2016-05-06 DIAGNOSIS — E860 Dehydration: Secondary | ICD-10-CM

## 2016-05-06 LAB — CBC
HEMATOCRIT: 28.9 % — AB (ref 36.0–46.0)
HEMOGLOBIN: 9.2 g/dL — AB (ref 12.0–15.0)
MCH: 28.2 pg (ref 26.0–34.0)
MCHC: 31.8 g/dL (ref 30.0–36.0)
MCV: 88.7 fL (ref 78.0–100.0)
Platelets: 408 10*3/uL — ABNORMAL HIGH (ref 150–400)
RBC: 3.26 MIL/uL — AB (ref 3.87–5.11)
RDW: 14.8 % (ref 11.5–15.5)
WBC: 7 10*3/uL (ref 4.0–10.5)

## 2016-05-06 MED ORDER — VANCOMYCIN 50 MG/ML ORAL SOLUTION: 125.0000 mg | Freq: Four times a day (QID) | ORAL | 0 refills | Status: DC

## 2016-05-06 MED ORDER — TRAMADOL HCL 50 MG PO TABS: 50.0000 mg | ORAL_TABLET | Freq: Four times a day (QID) | ORAL | 0 refills | Status: AC | PRN

## 2016-05-06 MED ORDER — VANCOMYCIN HCL 125 MG PO CAPS: 125.0000 mg | ORAL_CAPSULE | Freq: Four times a day (QID) | ORAL | 0 refills | Status: AC

## 2016-05-06 MED ORDER — METOPROLOL TARTRATE 25 MG PO TABS: 12.5000 mg | ORAL_TABLET | Freq: Two times a day (BID) | ORAL | 0 refills | Status: AC

## 2016-05-06 NOTE — Clinical Social Work Note (Addendum)
CS[facility] FL2 and [facility] and she reported patient is good to return to facility. Patient's [facility]. CSW signing off.  2:30PM CSW received return call from [employer] Geraldine [facility]. She reported she could not give oral solution of  Vancomycin and requested prescription be change to pill form or put in prefilled syringe. CSW notified MD. CSW [facility]end[facility]. CSW signing off.   Valero EnergyShonny Jefte Carithers, LCSW [phone]

## 2016-05-06 NOTE — Discharge Instructions (Signed)
Follow with Primary MD Kirt Boysarter, Monica, DO in 7 days   Get CBC, CMP,  checked  by Primary MD next visit.    Activity: As tolerated with Full fall precautions use walker/cane & assistance as needed   Disposition ALF   Diet: Heart Healthy with thin liquid , with feeding assistance and aspiration precautions.  For Heart failure patients - Check your Weight same time everyday, if you gain over 2 pounds, or you develop in leg swelling, experience more shortness of breath or chest pain, call your Primary MD immediately. Follow Cardiac Low Salt Diet and 1.5 lit/day fluid restriction.   On your next visit with your primary care physician please Get Medicines reviewed and adjusted.   Please request your Prim.MD to go over all Hospital Tests and Procedure/Radiological results at the follow up, please get all Hospital records sent to your Prim MD by signing hospital release before you go home.   If you experience worsening of your admission symptoms, develop shortness of breath, life threatening emergency, suicidal or homicidal thoughts you must seek medical attention immediately by calling 911 or calling your MD immediately  if symptoms less severe.  You Must read complete instructions/literature along with all the possible adverse reactions/side effects for all the Medicines you take and that have been prescribed to you. Take any new Medicines after you have completely understood and accpet all the possible adverse reactions/side effects.   Do not drive, operating heavy machinery, perform activities at heights, swimming or participation in water activities or provide baby sitting services if your were admitted for syncope or siezures until you have seen by Primary MD or a Neurologist and advised to do so again.  Do not drive when taking Pain medications.    Do not take more than prescribed Pain, Sleep and Anxiety Medications  Special Instructions: If you have smoked or chewed Tobacco  in the  last 2 yrs please stop smoking, stop any regular Alcohol  and or any Recreational drug use.  Wear Seat belts while driving.   Please note  You were cared for by a hospitalist during your hospital stay. If you have any questions about your discharge medications or the care you received while you were in the hospital after you are discharged, you can call the unit and asked to speak with the hospitalist on call if the hospitalist that took care of you is not available. Once you are discharged, your primary care physician will handle any further medical issues. Please note that NO REFILLS for any discharge medications will be authorized once you are discharged, as it is imperative that you return to your primary care physician (or establish a relationship with a primary care physician if you do not have one) for your aftercare needs so that they can reassess your need for medications and monitor your lab values.

## 2016-05-06 NOTE — Care Management Important Message (Signed)
Important Message  Patient Details  Name: Stephanie Colon MRN: [id number] Date of Birth: [birth year]   Medicare Important Message Given:  Yes    Tameshia Bonneville, Annamarie MajorCheryl U, RN 05/06/2016, 4:38 PM

## 2016-05-06 NOTE — Discharge Summary (Addendum)
Stephanie Colon, is a 82 y.o. female  DOB June 10, 1919  MRN [id number].  Admission date:  04/30/2016  Admitting Physician  Therisa DoyneAnastassia Doutova, MD  Discharge Date:  05/06/2016   Primary MD  Kirt Boysarter, Monica, DO  Recommendations for primary care physician for things to follow:  - please check CBC, BMP in 1 week.   Admission Diagnosis  afib   Discharge Diagnosis  afib    Active Problems:   Essential hypertension   Dehydration   UTI (lower urinary tract infection)   AKI (acute kidney injury) (HCC)   Afib (HCC)   Protein-calorie malnutrition, severe      Past Medical History:  Diagnosis Date  . Alzheimer disease   . Anemia   . Hypertension   . Renal disorder     Past Surgical History:  Procedure Laterality Date  . ABDOMINAL HYSTERECTOMY    . APPENDECTOMY    . CHOLECYSTECTOMY    . Internal surgery fron chron  1999  . OVARIAN CYST REMOVAL         History of present illness and  Hospital Course:     Kindly see H&P for history of present illness and admission details, please review complete Labs, Consult reports and Test reports for all details in brief  HPI  from the history and physical done on the day of admission 04/30/2016  Stephanie Colon is a 82 y.o. female with medical history significant of Alzheimer's dementia, hypertension, hyperlipidemia, depression, anxiety, L1 superior endplate compression chronic diastolic CHF    Presented with overall generalized fatigue for the past few days. 5 days ago patient was diagnosed with UTI and started on Bactrim but she's been getting progressively weak and hasn't been eating well and refusing to get out of bed  She's been gradually getting weaker patient has not endorsed any fevers but per records may have been febrile at the facility she's been confused more then usual.  Regarding pertinent Chronic problems: Patient has significant history of  dementia her records in the past she required to be on hospice even end-stage dimension His history of hypertension maintain on Cozaar. Patient has diastolic CHF as documented. Echogram in 2016 showing grade 1 diastolic dysfunction and preserved EF  IN ER:  Temp (24hrs), Avg:97.8 F (36.6 C), Min:97.8 F (36.6 C), Max:97.8 F (36.6 C)   Respirations up to 22 7000% on room air heart rate 84 blood pressure 118/55 To 137/48 lactic acid initially 3.36 after fluid administration down to 2.14  Troponin 0.05 WBC 7.6 hemoglobin 10.1 which is close to her baseline Sodium 131 potassium 3.1 gr 2.39 which is up from baseline 1.26  Chest x-ray showing left base scarring although could not rule out infectious process Following Medications were ordered in ER: Medications  sodium chloride 0.9 % bolus 1,000 mL (0 mLs Intravenous Stopped 04/30/16 1601)    And  sodium chloride 0.9 % bolus 500 mL (0 mLs Intravenous Stopped 04/30/16 1655)    And  sodium chloride 0.9 % bolus 250 mL (0  mLs Intravenous Stopped 04/30/16 1637)  piperacillin-tazobactam (ZOSYN) IVPB 3.375 g (0 g Intravenous Stopped 04/30/16 1612)  vancomycin (VANCOCIN) IVPB 1000 mg/200 mL premix (0 mg Intravenous Stopped 04/30/16 1642)      Hospitalist was called for admission for Dehydration and acute on chronic renal failure lactic acidosis   Hospital Course  1. AKI- Patient presented with acute kidney injury with creatinine 2.39. Treated with IV fluids, creatinine back to baseline, most recent is 1.06 2.  Hypokalemia - potassium was 2.4, repleted, most recent is 4.6 3. Diarrhea- C. difficile PCR positive, on by mouth vancomycin, to finish another 12 days on oral vancomycin, no further diarrhea, only small bowel movements over last 24 hours 4. Hypomagnesemia- magnesium was 1.2. Repleted  5.  New onset atrial fibrillation -patient has new onset atrial fibrillation, heart rate is controlled.patient was initially started on Lovenox which has been  discontinued at this time, cardiology has signed off patient not a candidate for anticoagulation as the risks of bleeding far outweighs benefits. Has aspirin allergy, cannot be started on aspirin. 6.  UTI- patient has been on Septra for past 3 days and was recently switched to Cipro. UA in the hospital is negative. Patient empirically started on ceftriaxone. Ceftriaxone has been discontinued. 7.  Prolonged QT interval- QTc interval is 510, repleted potassium and magnesium    Discharge Condition:  Stable   Follow UP  Follow-up Information    Kirt Boys, DO Follow up in 1 week(s).   Specialty:  Internal Medicine Contact information: [address] [place] [address] [phone]             Discharge Instructions  and  Discharge Medications     Discharge Instructions    Discharge instructions    Complete by:  As directed    Follow with Primary MD Kirt Boys, DO in 7 days   Get CBC, CMP,  checked  by Primary MD next visit.    Activity: As tolerated with Full fall precautions use walker/cane & assistance as needed   Disposition ALF   Diet: Heart Healthy with thin liquid , with feeding assistance and aspiration precautions.  For Heart failure patients - Check your Weight same time everyday, if you gain over 2 pounds, or you develop in leg swelling, experience more shortness of breath or chest pain, call your Primary MD immediately. Follow Cardiac Low Salt Diet and 1.5 lit/day fluid restriction.   On your next visit with your primary care physician please Get Medicines reviewed and adjusted.   Please request your Prim.MD to go over all Hospital Tests and Procedure/Radiological results at the follow up, please get all Hospital records sent to your Prim MD by signing hospital release before you go home.   If you experience worsening of your admission symptoms, develop shortness of breath, life threatening emergency, suicidal or homicidal thoughts you must  seek medical attention immediately by calling 911 or calling your MD immediately  if symptoms less severe.  You Must read complete instructions/literature along with all the possible adverse reactions/side effects for all the Medicines you take and that have been prescribed to you. Take any new Medicines after you have completely understood and accpet all the possible adverse reactions/side effects.   Do not drive, operating heavy machinery, perform activities at heights, swimming or participation in water activities or provide baby sitting services if your were admitted for syncope or siezures until you have seen by Primary MD or a Neurologist and advised to do so  again.  Do not drive when taking Pain medications.    Do not take more than prescribed Pain, Sleep and Anxiety Medications  Special Instructions: If you have smoked or chewed Tobacco  in the last 2 yrs please stop smoking, stop any regular Alcohol  and or any Recreational drug use.  Wear Seat belts while driving.   Please note  You were cared for by a hospitalist during your hospital stay. If you have any questions about your discharge medications or the care you received while you were in the hospital after you are discharged, you can call the unit and asked to speak with the hospitalist on call if the hospitalist that took care of you is not available. Once you are discharged, your primary care physician will handle any further medical issues. Please note that NO REFILLS for any discharge medications will be authorized once you are discharged, as it is imperative that you return to your primary care physician (or establish a relationship with a primary care physician if you do not have one) for your aftercare needs so that they can reassess your need for medications and monitor your lab values.   Increase activity slowly    Complete by:  As directed        Medication List    STOP taking these medications     sulfamethoxazole-trimethoprim 800-160 MG tablet Commonly known as:  BACTRIM DS,SEPTRA DS     TAKE these medications   acetaminophen 500 MG tablet Commonly known as:  TYLENOL Take 1 tablet (500 mg total) by mouth every 6 (six) hours as needed. What changed:  reasons to take this   alum & mag hydroxide-simeth 400-400-40 MG/5ML suspension Commonly known as:  MAALOX PLUS Take 30 mLs by mouth every 6 (six) hours as needed for indigestion.   EQ NUTRITIONAL SHAKE Liqd Take 118 mLs by mouth 3 (three) times daily.   feeding supplement (ENSURE ENLIVE) Liqd Take 237 mLs by mouth 2 (two) times daily between meals.   guaifenesin 100 MG/5ML syrup Commonly known as:  ROBITUSSIN Take 200 mg by mouth 3 (three) times daily as needed for cough.   loperamide 2 MG capsule Commonly known as:  IMODIUM Take 2 mg by mouth as needed for diarrhea or loose stools.   losartan 25 MG tablet Commonly known as:  COZAAR Take 1 tablet (25 mg total) by mouth daily.   magnesium hydroxide 400 MG/5ML suspension Commonly known as:  MILK OF MAGNESIA Take 30 mLs by mouth daily as needed for mild constipation.   metoprolol tartrate 25 MG tablet Commonly known as:  LOPRESSOR Take 0.5 tablets (12.5 mg total) by mouth 2 (two) times daily.   neomycin-bacitracin-polymyxin ointment Commonly known as:  NEOSPORIN Apply 1 application topically as needed for wound care. apply to eye   traMADol 50 MG tablet Commonly known as:  ULTRAM Take 1 tablet (50 mg total) by mouth every 6 (six) hours as needed for moderate pain.   vancomycin 125 MG capsule Commonly known as:  VANCOCIN Take 1 capsule (125 mg total) by mouth 4 (four) times daily.   vitamin B-12 1000 MCG tablet Commonly known as:  CYANOCOBALAMIN Take 1 tablet (1,000 mcg total) by mouth daily.         Diet and Activity recommendation: See Discharge Instructions above   Consults obtained - cardiology   Major procedures and Radiology Reports - PLEASE  review detailed and final reports for all details, in brief -     Dg  Chest 2 View  Result Date: 04/30/2016 CLINICAL DATA:  New onset atrial fibrillation an weakness. Duration of symptoms 3 days. EXAM: CHEST  2 VIEW COMPARISON:  07/01/2015 FINDINGS: Heart size is normal. Aortic atherosclerosis. Mediastinal shadows are otherwise normal. There is chronic scarring at the left lung base. These markings are presumed to represent chronic markings, but mild atelectasis or infiltrate at the left base is not excluded. No effusions. Arterial calcification is noted. No acute bone finding. IMPRESSION: Aortic atherosclerosis. Left base scarring. Cannot rule out some active atelectasis or infiltrate at the left base. Electronically Signed   By: Paulina Fusi M.D.   On: 04/30/2016 15:20    Micro Results    Recent Results (from the past 240 hour(s))  Culture, blood (Routine x 2)     Status: None   Collection Time: 04/30/16  3:30 PM  Result Value Ref Range Status   Specimen Description BLOOD RIGHT ANTECUBITAL  Final   Special Requests BOTTLES DRAWN AEROBIC ONLY  5CC  Final   Culture NO GROWTH 5 DAYS  Final   Report Status 05/05/2016 FINAL  Final  Culture, blood (Routine x 2)     Status: None   Collection Time: 04/30/16  3:35 PM  Result Value Ref Range Status   Specimen Description BLOOD LEFT ANTECUBITAL  Final   Special Requests IN PEDIATRIC BOTTLE  3CC  Final   Culture NO GROWTH 5 DAYS  Final   Report Status 05/05/2016 FINAL  Final  Urine culture     Status: Abnormal   Collection Time: 04/30/16  5:45 PM  Result Value Ref Range Status   Specimen Description URINE, RANDOM  Final   Special Requests NONE  Final   Culture MULTIPLE SPECIES PRESENT, SUGGEST RECOLLECTION (A)  Final   Report Status 05/02/2016 FINAL  Final  MRSA PCR Screening     Status: Abnormal   Collection Time: 05/01/16  6:08 PM  Result Value Ref Range Status   MRSA by PCR POSITIVE (A) NEGATIVE Final    Comment:        The GeneXpert  MRSA Assay (FDA approved for NASAL specimens only), is one component of a comprehensive MRSA colonization surveillance program. It is not intended to diagnose MRSA infection nor to guide or monitor treatment for MRSA infections. RESULT CALLED TO, READ BACK BY AND VERIFIED WITH: RN Marilynn Rail AT 1946 [id number] MARTINB   C difficile quick scan w PCR reflex     Status: Abnormal   Collection Time: 05/03/16  2:16 PM  Result Value Ref Range Status   C Diff antigen POSITIVE (A) NEGATIVE Final   C Diff toxin NEGATIVE NEGATIVE Final   C Diff interpretation Results are indeterminate. See PCR results.  Final  Clostridium Difficile by PCR     Status: Abnormal   Collection Time: 05/03/16  2:16 PM  Result Value Ref Range Status   Toxigenic C Difficile by pcr POSITIVE (A) NEGATIVE Final    Comment: Positive for toxigenic C. difficile with little to no toxin production. Only treat if clinical presentation suggests symptomatic illness. CRITICAL RESULT CALLED TO, READ BACK BY AND VERIFIED WITH: Marilynn Rail, RN AT 1940 ON 05/03/16 BY C. JESSUP, MLT.        Today   Subjective:   Stephanie Colon today has no headache,no chest or abdominal pain, diarrhea significantly subsided.  Objective:   Blood pressure (!) 163/69, pulse 81, temperature 98.7 F (37.1 C), temperature source Oral, resp. rate 16, height 5\' 7"  (1.702 m),  weight 59.9 kg (132 lb 0.9 oz), SpO2 98 %.   Intake/Output Summary (Last 24 hours) at 05/06/16 1456 Last data filed at 05/06/16 1400  Gross per 24 hour  Intake              420 ml  Output              701 ml  Net             -281 ml    Exam General exam: Appears calm and comfortable  Respiratory system: Clear to auscultation. Respiratory effort normal. Cardiovascular system: S1 & S2 heard, irregular. No JVD, murmurs, rubs, gallops or clicks. No pedal edema. Gastrointestinal system: Abdomen is nondistended, soft and nontender. No organomegaly or masses felt. Normal bowel sounds  heard. Central nervous system: Alert and oriented. No focal neurological deficits. Extremities: Symmetric 5 x 5 power. Skin: No rashes, lesions or ulcers Psychiatry: Judgement and insight appear normal. Mood & affect appropriate.    Data Review   CBC w Diff:  Lab Results  Component Value Date   WBC 7.0 05/06/2016   HGB 9.2 (L) 05/06/2016   HCT 28.9 (L) 05/06/2016   HCT 37.3 05/28/2015   PLT 408 (H) 05/06/2016   PLT 316 05/28/2015   LYMPHOPCT 14 04/30/2016   MONOPCT 7 04/30/2016   EOSPCT 1 04/30/2016   BASOPCT 0 04/30/2016    CMP:  Lab Results  Component Value Date   NA 136 05/04/2016   NA 141 05/28/2015   K 4.6 05/04/2016   CL 109 05/04/2016   CO2 21 (L) 05/04/2016   BUN 8 05/04/2016   BUN 25 05/28/2015   CREATININE 1.06 (H) 05/04/2016   PROT 5.4 (L) 05/01/2016   PROT 7.6 05/28/2015   ALBUMIN 1.9 (L) 05/01/2016   ALBUMIN 4.3 05/28/2015   BILITOT 0.5 05/01/2016   BILITOT 0.4 05/28/2015   ALKPHOS 52 05/01/2016   AST 22 05/01/2016   ALT 11 (L) 05/01/2016  .   Total Time in preparing paper work, data evaluation and todays exam - 35 minutes  ELGERGAWY, DAWOOD M.D on 05/06/2016 at 2:56 PM  [facility]   Office  [phone]

## 2016-07-23 DEATH — deceased

## 2016-10-01 ENCOUNTER — Telehealth: Payer: Self-pay | Admitting: Internal Medicine

## 2016-10-01 NOTE — Telephone Encounter (Signed)
I spoke with Stephanie Colon to check on the patient.  She stated that she passed away in Nov. 2017. VDM (DD)
